# Patient Record
Sex: Male | Born: 1975 | Race: White | Hispanic: No | Smoking: Current every day smoker
Health system: Southern US, Community
[De-identification: ages and names within clinical notes are randomized; demographics above are authoritative.]

## PROBLEM LIST (undated history)

## (undated) DIAGNOSIS — T07XXXA Unspecified multiple injuries, initial encounter: Secondary | ICD-10-CM

## (undated) DIAGNOSIS — E119 Type 2 diabetes mellitus without complications: Secondary | ICD-10-CM

## (undated) DIAGNOSIS — G8929 Other chronic pain: Secondary | ICD-10-CM

## (undated) DIAGNOSIS — M549 Dorsalgia, unspecified: Secondary | ICD-10-CM

## (undated) DIAGNOSIS — S2239XA Fracture of one rib, unspecified side, initial encounter for closed fracture: Secondary | ICD-10-CM

## (undated) DIAGNOSIS — S2249XA Multiple fractures of ribs, unspecified side, initial encounter for closed fracture: Secondary | ICD-10-CM

## (undated) DIAGNOSIS — Z22322 Carrier or suspected carrier of Methicillin resistant Staphylococcus aureus: Secondary | ICD-10-CM

## (undated) HISTORY — PX: NO PAST SURGERIES: SHX2092

---

## 1997-11-30 ENCOUNTER — Emergency Department (HOSPITAL_COMMUNITY): Admission: EM | Admit: 1997-11-30 | Discharge: 1997-11-30 | Payer: Self-pay | Admitting: Emergency Medicine

## 2004-01-09 ENCOUNTER — Emergency Department (HOSPITAL_COMMUNITY): Admission: EM | Admit: 2004-01-09 | Discharge: 2004-01-10 | Payer: Self-pay | Admitting: Emergency Medicine

## 2008-01-13 DIAGNOSIS — Z22322 Carrier or suspected carrier of Methicillin resistant Staphylococcus aureus: Secondary | ICD-10-CM

## 2008-01-13 HISTORY — DX: Carrier or suspected carrier of methicillin resistant Staphylococcus aureus: Z22.322

## 2009-08-07 ENCOUNTER — Inpatient Hospital Stay (HOSPITAL_COMMUNITY): Admission: EM | Admit: 2009-08-07 | Discharge: 2009-08-11 | Payer: Self-pay | Admitting: Emergency Medicine

## 2010-03-29 LAB — CBC
HCT: 47.8 % (ref 39.0–52.0)
Hemoglobin: 13.8 g/dL (ref 13.0–17.0)
Hemoglobin: 15.2 g/dL (ref 13.0–17.0)
MCH: 32.9 pg (ref 26.0–34.0)
MCHC: 34.5 g/dL (ref 30.0–36.0)
MCHC: 34.5 g/dL (ref 30.0–36.0)
MCHC: 34.5 g/dL (ref 30.0–36.0)
MCHC: 34.9 g/dL (ref 30.0–36.0)
MCV: 95.2 fL (ref 78.0–100.0)
MCV: 95.4 fL (ref 78.0–100.0)
MCV: 96.6 fL (ref 78.0–100.0)
Platelets: 151 10*3/uL (ref 150–400)
Platelets: 177 10*3/uL (ref 150–400)
Platelets: 178 10*3/uL (ref 150–400)
RBC: 4.95 MIL/uL (ref 4.22–5.81)
WBC: 10.9 10*3/uL — ABNORMAL HIGH (ref 4.0–10.5)
WBC: 4 10*3/uL (ref 4.0–10.5)

## 2010-03-29 LAB — WOUND CULTURE

## 2010-03-29 LAB — RAPID URINE DRUG SCREEN, HOSP PERFORMED
Barbiturates: NOT DETECTED
Benzodiazepines: NOT DETECTED
Tetrahydrocannabinol: POSITIVE — AB

## 2010-03-29 LAB — BASIC METABOLIC PANEL
Calcium: 9.2 mg/dL (ref 8.4–10.5)
Chloride: 100 mEq/L (ref 96–112)
Chloride: 103 mEq/L (ref 96–112)
Creatinine, Ser: 0.81 mg/dL (ref 0.4–1.5)
GFR calc Af Amer: >60 mL/min (ref >60)
GFR calc Af Amer: >60 mL/min (ref >60)
GFR calc non Af Amer: >60 mL/min (ref >60)
Glucose, Bld: 100 mg/dL — ABNORMAL HIGH (ref 70–99)
Potassium: 3.7 mEq/L (ref 3.5–5.1)
Sodium: 139 mEq/L (ref 135–145)

## 2010-03-29 LAB — DIFFERENTIAL
Lymphs Abs: 1 10*3/uL (ref 0.7–4.0)
Monocytes Relative: 8 % (ref 3–12)
Neutro Abs: 9 10*3/uL — ABNORMAL HIGH (ref 1.7–7.7)

## 2010-03-29 LAB — CULTURE, BLOOD (ROUTINE X 2)

## 2010-08-18 ENCOUNTER — Emergency Department (HOSPITAL_COMMUNITY)
Admission: EM | Admit: 2010-08-18 | Discharge: 2010-08-18 | Disposition: A | Discharge status: Home / Self Care | Payer: Self-pay | Attending: Emergency Medicine | Admitting: Emergency Medicine

## 2010-08-18 DIAGNOSIS — M7989 Other specified soft tissue disorders: Secondary | ICD-10-CM | POA: Insufficient documentation

## 2010-08-18 DIAGNOSIS — I4949 Other premature depolarization: Secondary | ICD-10-CM | POA: Insufficient documentation

## 2010-08-18 DIAGNOSIS — M79609 Pain in unspecified limb: Secondary | ICD-10-CM | POA: Insufficient documentation

## 2011-02-16 ENCOUNTER — Encounter (HOSPITAL_COMMUNITY): Payer: Self-pay | Admitting: Emergency Medicine

## 2011-02-16 ENCOUNTER — Emergency Department (HOSPITAL_COMMUNITY)
Admission: EM | Admit: 2011-02-16 | Discharge: 2011-02-16 | Disposition: A | Discharge status: Home / Self Care | Payer: Medicaid Other | Attending: Emergency Medicine | Admitting: Emergency Medicine

## 2011-02-16 DIAGNOSIS — M79609 Pain in unspecified limb: Secondary | ICD-10-CM | POA: Insufficient documentation

## 2011-02-16 DIAGNOSIS — L738 Other specified follicular disorders: Secondary | ICD-10-CM | POA: Insufficient documentation

## 2011-02-16 DIAGNOSIS — IMO0001 Reserved for inherently not codable concepts without codable children: Secondary | ICD-10-CM | POA: Insufficient documentation

## 2011-02-16 DIAGNOSIS — R221 Localized swelling, mass and lump, neck: Secondary | ICD-10-CM | POA: Insufficient documentation

## 2011-02-16 DIAGNOSIS — R220 Localized swelling, mass and lump, head: Secondary | ICD-10-CM | POA: Insufficient documentation

## 2011-02-16 DIAGNOSIS — M5430 Sciatica, unspecified side: Secondary | ICD-10-CM | POA: Insufficient documentation

## 2011-02-16 DIAGNOSIS — M545 Low back pain, unspecified: Secondary | ICD-10-CM | POA: Insufficient documentation

## 2011-02-16 DIAGNOSIS — L731 Pseudofolliculitis barbae: Secondary | ICD-10-CM

## 2011-02-16 DIAGNOSIS — R51 Headache: Secondary | ICD-10-CM | POA: Insufficient documentation

## 2011-02-16 DIAGNOSIS — F411 Generalized anxiety disorder: Secondary | ICD-10-CM | POA: Insufficient documentation

## 2011-02-16 DIAGNOSIS — M25569 Pain in unspecified knee: Secondary | ICD-10-CM | POA: Insufficient documentation

## 2011-02-16 DIAGNOSIS — Z8614 Personal history of Methicillin resistant Staphylococcus aureus infection: Secondary | ICD-10-CM | POA: Insufficient documentation

## 2011-02-16 HISTORY — DX: Carrier or suspected carrier of methicillin resistant Staphylococcus aureus: Z22.322

## 2011-02-16 HISTORY — DX: Dorsalgia, unspecified: M54.9

## 2011-02-16 MED ORDER — DIAZEPAM 5 MG PO TABS: 5.0000 mg | ORAL_TABLET | Freq: Two times a day (BID) | ORAL | Status: AC

## 2011-02-16 MED ORDER — HYDROCODONE-ACETAMINOPHEN 5-325 MG PO TABS: 1.0000 | ORAL_TABLET | ORAL | Status: AC | PRN

## 2011-02-16 MED ORDER — DOXYCYCLINE HYCLATE 100 MG PO CAPS: 100.0000 mg | ORAL_CAPSULE | Freq: Two times a day (BID) | ORAL | Status: AC

## 2011-02-16 MED ORDER — IBUPROFEN 800 MG PO TABS: 800.0000 mg | ORAL_TABLET | Freq: Three times a day (TID) | ORAL | Status: AC

## 2011-02-16 NOTE — ED Provider Notes (Signed)
History     CSN: [id number]  Arrival date & time 02/16/11  1039   First MD Initiated Contact with Patient 02/16/11 1113      Chief Complaint  Patient presents with  . Back Pain    (Consider location/radiation/quality/duration/timing/severity/associated sxs/prior treatment) Patient is a 36 y.o. male presenting with back pain. The history is provided by the patient.  Back Pain  This is a new problem. The current episode started more than 1 week ago. The problem occurs constantly. The problem has not changed since onset.The pain is associated with no known injury. The pain is present in the lumbar spine and gluteal region. The quality of the pain is described as shooting. The pain radiates to the right thigh, right knee and right foot. The pain is moderate. The symptoms are aggravated by bending, twisting and certain positions. The pain is the same all the time. Stiffness is present in the morning. Pertinent negatives include no fever, no numbness, no bowel incontinence, no perianal numbness, no bladder incontinence, no dysuria, no pelvic pain, no tingling and no weakness. He has tried analgesics for the symptoms.   Pt with hx of back pain presents with 3 weeks of pain. Pain radiates into R buttock and down R leg. Denies difficulty walking/changes in gait. 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 No known injury; states he was sleeping on an uncomfortable bed and thought that may be causing his sx.  He also presents with a bump to his inner R nostril. States it has been there for 3 days and seems to be worsening; has noted redness and swelling around it. 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  Past Medical History  Diagnosis Date  . Back pain   . MRSA (methicillin resistant staph aureus) culture positive     History reviewed. No pertinent past surgical history.  No family history on file.  History  Substance Use Topics  . Smoking status: Current Everyday Smoker  . Smokeless  tobacco: Not on file  . Alcohol Use: Yes      Review of Systems  Constitutional: Negative for fever.  HENT: Negative for facial swelling.   Gastrointestinal: Negative for bowel incontinence.  Genitourinary: Negative for bladder incontinence, dysuria and pelvic pain.  Musculoskeletal: Positive for back pain.  Neurological: Negative for tingling, weakness and numbness.  All other systems reviewed and are negative.    Allergies  Review of patient's allergies indicates no known allergies.  Home Medications  No current outpatient prescriptions on file.  BP 125/72  Pulse 80  Temp 98.3 F (36.8 C)  Resp 16  SpO2 99%  Physical Exam  Nursing note and vitals reviewed. Constitutional: He appears well-developed and well-nourished. No distress.  HENT:  Head: Normocephalic and atraumatic.  Right Ear: External ear normal.  Left Ear: External ear normal.       Mild erythema just under R nostril. Mild swelling noted to inside of R nostril without obvious area of fluctuance.  Eyes: Conjunctivae and EOM are normal.  Neck: Normal range of motion. Neck supple.  Cardiovascular: Normal rate.   Pulmonary/Chest: Effort normal.  Musculoskeletal: He exhibits no edema.       Spine: No palpable stepoff, crepitus, or gross deformity appreciated. No midline tenderness. Spasm of paravertebral muscles R, tenderness to palp over gluteus. R straight leg raise +. LEs neurovasc intact b/l, good DP/PT pulses.   Lymphadenopathy:    He has no cervical adenopathy.  Neurological: He is  alert. He has normal strength.  Reflex Scores:      Achilles reflexes are 2+ on the right side and 2+ on the left side. Skin: Skin is warm and dry. He is not diaphoretic.  Psychiatric: His mood appears anxious.    ED Course  Procedures (including critical care time)  Labs Reviewed - No data to display No results found.   1. Sciatica   2. Ingrown hair       MDM  This generally healthy male presents with back  pain x 3 weeks. He has no "red flag" symptoms indicating more worrisome back pain etiologies. Positive straight leg raise. Spasm to R paravertebrals as well. Will start on high dose NSAID for several days. Encouraged icing, stretching exercise. Discussed importance of ortho f/u.  The redness to his nose appears to be folliculitis/ingrowing hair. No indication for drainage. Will start on doxy as he is MRSA+.   Return precautions discussed.        Grant Fontana, [place] 02/16/11 2111

## 2011-02-16 NOTE — ED Notes (Signed)
Back pain x 3 weeks sleeping on daybed and thought it was that  But is no better and nose has a bump that he thinks is infected x 3 days pt aaox3

## 2011-02-18 NOTE — ED Provider Notes (Signed)
Medical screening examination/treatment/procedure(s) were performed by non-physician practitioner and as supervising physician I was immediately available for consultation/collaboration.  Gerhard Munch, MD 02/18/11 2047

## 2011-04-14 ENCOUNTER — Other Ambulatory Visit: Payer: Self-pay | Admitting: Family Medicine

## 2011-04-14 DIAGNOSIS — M545 Low back pain: Secondary | ICD-10-CM

## 2011-04-14 DIAGNOSIS — R202 Paresthesia of skin: Secondary | ICD-10-CM

## 2011-04-15 ENCOUNTER — Inpatient Hospital Stay: Admission: RE | Admit: 2011-04-15 | Payer: Medicaid Other | Source: Ambulatory Visit

## 2011-04-21 ENCOUNTER — Inpatient Hospital Stay: Admission: RE | Admit: 2011-04-21 | Payer: Medicaid Other | Source: Ambulatory Visit

## 2011-08-10 ENCOUNTER — Ambulatory Visit
Admission: RE | Admit: 2011-08-10 | Discharge: 2011-08-10 | Disposition: A | Discharge status: Home / Self Care | Payer: Medicaid Other | Source: Ambulatory Visit | Attending: Rehabilitation | Admitting: Rehabilitation

## 2011-08-10 ENCOUNTER — Other Ambulatory Visit: Payer: Self-pay | Admitting: Rehabilitation

## 2011-08-10 DIAGNOSIS — M549 Dorsalgia, unspecified: Secondary | ICD-10-CM

## 2013-06-13 ENCOUNTER — Encounter (HOSPITAL_COMMUNITY): Payer: Self-pay | Admitting: Emergency Medicine

## 2013-06-13 ENCOUNTER — Emergency Department (HOSPITAL_COMMUNITY)
Admission: EM | Admit: 2013-06-13 | Discharge: 2013-06-13 | Disposition: A | Discharge status: Home / Self Care | Payer: Medicaid Other | Attending: Emergency Medicine | Admitting: Emergency Medicine

## 2013-06-13 DIAGNOSIS — G8929 Other chronic pain: Secondary | ICD-10-CM | POA: Insufficient documentation

## 2013-06-13 DIAGNOSIS — F17200 Nicotine dependence, unspecified, uncomplicated: Secondary | ICD-10-CM | POA: Insufficient documentation

## 2013-06-13 DIAGNOSIS — M545 Low back pain, unspecified: Secondary | ICD-10-CM | POA: Insufficient documentation

## 2013-06-13 DIAGNOSIS — Z8614 Personal history of Methicillin resistant Staphylococcus aureus infection: Secondary | ICD-10-CM | POA: Insufficient documentation

## 2013-06-13 DIAGNOSIS — M549 Dorsalgia, unspecified: Secondary | ICD-10-CM

## 2013-06-13 MED ORDER — PREDNISONE 20 MG PO TABS: 40.0000 mg | ORAL_TABLET | Freq: Every day | ORAL | Status: DC

## 2013-06-13 MED ORDER — OXYCODONE-ACETAMINOPHEN 5-325 MG PO TABS: 1.0000 | ORAL_TABLET | ORAL | Status: DC | PRN

## 2013-06-13 NOTE — ED Provider Notes (Signed)
Medical screening examination/treatment/procedure(s) were performed by non-physician practitioner and as supervising physician I was immediately available for consultation/collaboration.   EKG Interpretation None       Doug Sou, MD 06/13/13 [phone]

## 2013-06-13 NOTE — Discharge Instructions (Signed)
Take the prescribed medication as directed. Follow-up with the [facility] or other primary care physician in the area. I have attached a resource guide to help you with this. Return to the ED for new or worsening symptoms.   Emergency Department Resource Guide 1) Find a Doctor and Pay Out of Pocket Although you won't have to find out who is covered by your insurance plan, it is a good idea to ask around and get recommendations. You will then need to call the office and see if the doctor you have chosen will accept you as a new patient and what types of options they offer for patients who are self-pay. Some doctors offer discounts or will set up payment plans for their patients who do not have insurance, but you will need to ask so you aren't surprised when you get to your appointment.  2) Contact Your Local Health Department Not all health departments have doctors that can see patients for sick visits, but many do, so it is worth a call to see if yours does. If you don't know where your local health department is, you can check in your phone book. The CDC also has a tool to help you locate your state's health department, and many state websites also have listings of all of their local health departments.  3) Find a Walk-in Clinic If your illness is not likely to be very severe or complicated, you may want to try a walk in clinic. These are popping up all over the country in pharmacies, drugstores, and shopping centers. They're usually staffed by nurse practitioners or physician assistants that have been trained to treat common illnesses and complaints. They're usually fairly quick and inexpensive. However, if you have serious medical issues or chronic medical problems, these are probably not your best option.  No Primary Care Doctor: - Call Health Connect at  [phone] - they can help you locate a primary care doctor that  accepts your insurance, provides certain services, etc. - Physician  Referral Service- [phone]  Chronic Pain Problems: Organization         Address  Phone   Notes  [facility]  [phone] Patients need to be referred by their primary care doctor.   Medication Assistance: Organization         Address  Phone   Notes  [facility] Medication [facility] [address] [place] [place], [place] [address] [phone] --Must be a resident of [facility] -- Must have NO insurance coverage whatsoever (no Medicaid/ Medicare, etc.) -- The pt. MUST have a primary care doctor that directs their care regularly and follows them in the community   [facility]  [phone]   [employer]  [phone]    Agencies that provide inexpensive medical care: Organization         Address  Phone   Notes  [facility]  [phone]   [facility]    [phone]   [facility] [address] [place], [place] [address] [phone]   [facility] [address] [place], [place] [phone]   Planned Parenthood    [phone]   [facility]    [phone]   Community Health and [facility]  [address], [place] Phone:  [phone], Fax:  [phone] Hours of Operation:  9 am - 6 pm, M-F.  Also accepts Medicaid/Medicare and  self-pay.  [facility] for [place], [address], [place] Phone: [phone], Fax: [phone]. Hours of Operation:  8:30 am - 5:30 pm, M-F.  Also accepts Medicaid and self-pay.  [facility] [address] [place] Phone: [phone]   [place], [place], [place] [phone], Ext. [phone] Mondays & Thursdays: 7-9 AM.  First 15 patients are seen on a first come, first serve basis.    [place] Providers:  Organization         Address  Phone   Notes  [facility] [address], [place] [phone] Also accepts self-pay patients.  [facility] [address], [place]  [phone]   [place], [address], [place] [phone]   [facility] [address] [place] [phone]   Lucianne Lei [address], [place]   [phone] Only accepts [place] Access [place] patients after they have their name applied to their card.   Self-Pay (no insurance) in [employer]:  Organization         Address  Phone   Notes  Sickle Cell Patients, [facility] Internal Medicine [place] [phone]   [facility] Urgent Care [place] [phone]   [facility] [place]  [place], [place],  [phone]   [facility]/Dr. Osei-Bonsu  [address] [place] or [address], [place] [phone] Phone number for both [place] and [place] locations is the same.  Urgent Medical and [facility] [address] [place] [phone]   [facility] [address], [place] or [address] [phone]   [facility] [address] [place] [phone], phone; [phone], fax Sees patients 1st and 3rd Saturday of every month.  Must not qualify for public or private insurance (i.e. Medicaid, Medicare, [place] Health Choice, Veterans' Benefits)  Household income should be no more than 200% of the poverty level The clinic cannot treat you if you are pregnant or think you are pregnant  Sexually transmitted diseases are not treated at the clinic.    Dental Care: Organization         Address  Phone  Notes  [facility] Department of [facility] [phone] Accepts children up to age 40 who are enrolled  in [place] or [place]; pregnant women with a Medicaid card; and children who have applied for Medicaid or [place] Health Choice, but were declined, whose parents can pay a reduced fee at time of service.  [facility] Department of [facility]  [address], [place] [phone] Accepts children up to age 37 who are enrolled in [place] or [place]; pregnant women with a Medicaid card; and children who have applied for Medicaid or [place] Health Choice, but were declined, whose parents can pay a reduced fee at time of service.  [place] Adult [facility] Access PROGRAM  [place] [phone] Patients are seen by appointment only. Walk-ins are not accepted. [place] will see patients 57 years of age and older. Monday - Tuesday (8am-5pm) Most Wednesdays (8:30-5pm) $  30 per visit, cash only  [facility] Adult [employer] PROGRAM  [address], [facility] [phone] Patients are seen by appointment only. Walk-ins are not accepted. [place] will see patients 62 years of age and older. One Wednesday Evening (Monthly: Volunteer Based).  $30 per visit, cash only  [place]  [phone] for adults; Children under age 14, call Graduate Pediatric Dentistry at [phone]. Children aged 37-14, please call [phone] to request a pediatric application.  Dental services are provided in all areas of dental care including fillings, crowns and bridges, complete and partial dentures, implants, gum treatment, root canals, and extractions. Preventive care is also provided. Treatment is provided to both adults and children. Patients are selected via a lottery and there is often a waiting list.   [facility] [address] [place]  [phone] [email or web address]   [facility] [address] [place], [place] [phone], Ext. [phone] Second and Fourth Thursday of each month, opens at  6:30 AM; Clinic ends at 9 AM.  Patients are seen on a first-come first-served basis, and a limited number are seen during each clinic.   [facility]  [address] Hillard Danker [place], [place] [phone]   Eligibility Requirements You must have lived in [place], [place], or [place] counties for at least the last three months.   You cannot be eligible for state or federal sponsored [employer], including [employer], [place], or [employer].   You generally cannot be eligible for healthcare insurance through your employer.    How to apply: Eligibility screenings are held every Tuesday and Wednesday afternoon from 1:00 pm until 4:00 pm. You do not need an appointment for the interview!  [facility] [address] [place], [place]   [place]  [place] Department  [place]  [phone]    Behavioral Health Resources in the Community: Intensive Outpatient Programs Organization         Address  Phone  Notes  [facility] [place]. [address] [place], [place] [phone]   [facility] Outpatient [address], [place], [place]   ADS: Alcohol & Drug Svcs [address] [place], [employer]   [place] [address]  [place], [place] or [phone]   Substance Abuse Resources Organization         Address  Phone  Notes  Alcohol and Drug Services  [phone]   [place]  [phone]   [facility] [place]   Chinita Pester  [phone]   Residential & Outpatient Substance Abuse Program  [phone]   Psychological Services Organization         Address  Phone  Notes  [facility]  [place]  [phone]   [place] [address] [place] or [phone]    Mobile Crisis Teams Organization         Address  Phone  Notes  [facility], Mobile Crisis Care Unit  [phone]   [facility]  [address] [place], [place]   [place] [address] [place] [phone]    Self-Help/Support Groups Organization         Address  Phone  Notes  [facility]ental [facility]oc[facility] of [place] - variety of support groups  [place] Call for more information  Narcotics Anonymous (NA), [facility] [address], [employer] [place]  2 meetings at this location   Special educational needs teacher         Address  Phone  Notes  [facility] Residential Treatment [place],    [place]  [phone]   [facility]  [address], [place] [phone], [place], [place]   [place], [place] [phone] Admissions: 8am-3pm M-F  Incentives Substance [place] [address],    [place], [place] [phone]   [facility] [address] [place], [place], [place]   [facility] [address],  [place], [place]   [facility]ight Programs - Intensive Outpatient [address] Kristeen [address], [place], [place]   [facility] ([place].) [place].,  [place], [place] [phone] or [phone]   [facility] Services (RTS) [address] [place], [place] Accepts Medicaid  [place] [address]  [place] [phone] Substance Abuse/Addiction Treatment   [facility] Organization         Address  Phone  Notes  [facility]  [phone]   Domenic Schwab, PhD [address] Arlis Porta [place], [place]   [phone] or [phone]   [place], [place] [phone]   [facility]  [address] [place], [place] [phone] Insurance/Medicaid/sponsorship through [facility] and Families [address], [address] [place]                                    [place], [place] [phone] [place] [address], [place] [phone]    Dr. Adele Schilder  [phone]   [facility] Dept. 1) [address], [place] 2) [place] 3)  [place] [address], [place] [phone]  [phone]   [place] [phone] or [phone] (After Hours)

## 2013-06-13 NOTE — ED Provider Notes (Signed)
CSN: [id number]     Arrival date & time 06/13/13  1334 History  This chart was scribed for non-physician practitioner Sharilyn SitesLisa Noraa Pickeral, PA-C working with Doug SouSam Jacubowitz, MD by Leone PayorSonum Patel, ED Scribe. This patient was seen in room TR10C/TR10C and the patient's care was started at 3:09 PM.    Chief Complaint  Patient presents with  . Back Pain    The history is provided by the patient. No language interpreter was used.    HPI Comments: Mitchell Long is a 38 y.o. male with past medical history of back pain who presents to the Emergency Department complaining of a few days of constant, unchanged, left low back pain that radiates down the LLE. He describes this pain as a burning sensation. He denies recent falls, injuries, or trauma. He reports similar symptoms on the right side when he was diagnosed with sciatica. He denies numbness, paresthesias, or weakness of lower extremities.  No loss of bowel or bladder control.  Has been taking tylenol or motrin without noted improvement.  Past Medical History  Diagnosis Date  . Back pain   . MRSA (methicillin resistant staph aureus) culture positive    History reviewed. No pertinent past surgical history. History reviewed. No pertinent family history. History  Substance Use Topics  . Smoking status: Current Every Day Smoker  . Smokeless tobacco: Not on file  . Alcohol Use: Yes    Review of Systems  Musculoskeletal: Positive for back pain.  Neurological: Negative for weakness and numbness.  All other systems reviewed and are negative.     Allergies  Vicodin  Home Medications   Prior to Admission medications   Not on File   BP 119/71  Pulse 66  Temp(Src) 98.1 F (36.7 C) (Oral)  Resp 14  SpO2 97% Physical Exam  Nursing note and vitals reviewed. Constitutional: He is oriented to person, place, and time. He appears well-developed and well-nourished. No distress.  HENT:  Head: Normocephalic and atraumatic.  Mouth/Throat: Oropharynx  is clear and moist.  Eyes: Conjunctivae and EOM are normal. Pupils are equal, round, and reactive to light.  Neck: Normal range of motion. Neck supple.  Cardiovascular: Normal rate, regular rhythm and normal heart sounds.   Pulmonary/Chest: Effort normal and breath sounds normal. No respiratory distress. He has no wheezes.  Musculoskeletal: Normal range of motion. He exhibits tenderness.  Pain of bilateral SI joints.  No midline tenderness.  No gross deformities. Full ROM. +SLR bilaterally, right worse than left.  Distal sensation intact BLE.  Pain worse with ambulating but no difficulty doing so.  Neurological: He is alert and oriented to person, place, and time.  Skin: Skin is warm and dry. He is not diaphoretic.  Psychiatric: He has a normal mood and affect.    ED Course  Procedures (including critical care time)  DIAGNOSTIC STUDIES: Oxygen Saturation is 97% on RA, adequate by my interpretation.    COORDINATION OF CARE: 3:14 PM Discussed treatment plan with pt at bedside and pt agreed to plan.   Labs Review Labs Reviewed - No data to display  Imaging Review No results found.   EKG Interpretation None      MDM   Final diagnoses:  Back pain   Acute on chronic back pain related to his sciatica. Patient has no red flag symptoms on exam to suggest cauda equina or other neurovascular compromise. Patient started on Percocet and prednisone. He was strongly encouraged to followup with a primary care physician for routine management  of his pain.  Discussed plan with patient, he/she acknowledged understanding and agreed with plan of care.  Return precautions given for new or worsening symptoms.  I personally performed the services described in this documentation, which was scribed in my presence. The recorded information has been reviewed and is accurate.  Garlon Hatchet, PA-C 06/13/13 [phone]

## 2013-06-13 NOTE — ED Notes (Addendum)
Pt reports hx of back pain to right side but this am started having burning pain to left lower back that radiates down his leg.

## 2013-06-13 NOTE — ED Notes (Signed)
Hx of chronic right lower back pain. Woke yesterday with left lower back pain to left leg.

## 2013-06-19 ENCOUNTER — Encounter (HOSPITAL_COMMUNITY): Payer: Self-pay | Admitting: Emergency Medicine

## 2013-06-19 ENCOUNTER — Emergency Department (HOSPITAL_COMMUNITY)
Admission: EM | Admit: 2013-06-19 | Discharge: 2013-06-19 | Disposition: A | Discharge status: Home / Self Care | Payer: Medicaid Other | Attending: Emergency Medicine | Admitting: Emergency Medicine

## 2013-06-19 DIAGNOSIS — Z8614 Personal history of Methicillin resistant Staphylococcus aureus infection: Secondary | ICD-10-CM | POA: Insufficient documentation

## 2013-06-19 DIAGNOSIS — M545 Low back pain, unspecified: Secondary | ICD-10-CM | POA: Insufficient documentation

## 2013-06-19 DIAGNOSIS — F17200 Nicotine dependence, unspecified, uncomplicated: Secondary | ICD-10-CM | POA: Insufficient documentation

## 2013-06-19 DIAGNOSIS — IMO0002 Reserved for concepts with insufficient information to code with codable children: Secondary | ICD-10-CM | POA: Insufficient documentation

## 2013-06-19 DIAGNOSIS — R52 Pain, unspecified: Secondary | ICD-10-CM | POA: Insufficient documentation

## 2013-06-19 DIAGNOSIS — G8929 Other chronic pain: Secondary | ICD-10-CM | POA: Insufficient documentation

## 2013-06-19 HISTORY — DX: Dorsalgia, unspecified: M54.9

## 2013-06-19 HISTORY — DX: Other chronic pain: G89.29

## 2013-06-19 MED ORDER — OXYCODONE-ACETAMINOPHEN 5-325 MG PO TABS
1.0000 | ORAL_TABLET | Freq: Once | ORAL | Status: AC
  Administered 2013-06-19: 1 via ORAL
  Filled 2013-06-19: qty 1

## 2013-06-19 MED ORDER — KETOROLAC TROMETHAMINE 30 MG/ML IJ SOLN
15.0000 mg | Freq: Once | INTRAMUSCULAR | Status: DC
  Filled 2013-06-19: qty 1

## 2013-06-19 MED ORDER — OXYCODONE-ACETAMINOPHEN 5-325 MG PO TABS: 1.0000 | ORAL_TABLET | Freq: Three times a day (TID) | ORAL | Status: DC | PRN

## 2013-06-19 MED ORDER — METHOCARBAMOL 500 MG PO TABS: 500.0000 mg | ORAL_TABLET | Freq: Two times a day (BID) | ORAL | Status: DC

## 2013-06-19 MED ORDER — IBUPROFEN 600 MG PO TABS: 600.0000 mg | ORAL_TABLET | Freq: Four times a day (QID) | ORAL | Status: DC | PRN

## 2013-06-19 NOTE — ED Provider Notes (Signed)
CSN: [id number]     Arrival date & time 06/19/13  2035 History  This chart was scribed for Mitchell Mutton, PA-C working with [name], MD by [name], ED Scribe. This patient was seen in room TR08C/TR08C and the patient's care was started at 10:35 PM.    Chief Complaint  Patient presents with  . Back Pain   HPI HPI Comments: Mitchell Long is a 38 y.o. male with PMHx of chronic back pain presenting to the ED with acute exacerbation of chronic back pain that has been ongoing for the past 2 weeks. As per patient reported that the back pain has gotten worse ever since he slept on an air mattress. Stated that most of his discomfort is localized to the lower back described as a sharp, shooting pain that is constant with radiation down the left leg. Stated that he was seen and assessed in the ED setting a couple of days ago where pain medications were prescribed - stated that he was prescribed Percocets and prednisone - stated that the medications made the pain somewhat bearable, but did not give full relief. Stated that he is oriented to the medications, last dose was yesterday. As per patient, reported that he's been battling back pain since he was 38 years of age. Denied fall, injuries, trauma, loss of sensation to the lower extremities, chest pain, shortness of breath, difficulty breathing, urinary or bowel incontinence. PCP none Neurosurgery none  Past Medical History  Diagnosis Date  . Back pain   . MRSA (methicillin resistant staph aureus) culture positive   . Back pain, chronic    History reviewed. No pertinent past surgical history. No family history on file. History  Substance Use Topics  . Smoking status: Current Every Day Smoker  . Smokeless tobacco: Not on file  . Alcohol Use: Yes    Review of Systems  Respiratory: Negative for shortness of breath.   Cardiovascular: Negative for chest pain.  Musculoskeletal: Positive for back pain.  Neurological: Negative for  weakness and numbness.    Allergies  Vicodin  Home Medications   Prior to Admission medications   Medication Sig Start Date End Date Taking? Authorizing Provider  oxyCODONE-acetaminophen (PERCOCET/ROXICET) 5-325 MG per tablet Take 1 tablet by mouth every 4 (four) hours as needed. 06/13/13  Yes Garlon Hatchet, PA-C  predniSONE (DELTASONE) 20 MG tablet Take 2 tablets (40 mg total) by mouth daily. Take 40 mg by mouth daily for 3 days, then 20mg  by mouth daily for 3 days, then 10mg  daily for 3 days 06/13/13  Yes Garlon Hatchet, PA-C  ibuprofen (ADVIL,MOTRIN) 600 MG tablet Take 1 tablet (600 mg total) by mouth every 6 (six) hours as needed. 06/19/13   Conrado Nance, PA-C  methocarbamol (ROBAXIN) 500 MG tablet Take 1 tablet (500 mg total) by mouth 2 (two) times daily. 06/19/13   Marilouise Densmore, PA-C  oxyCODONE-acetaminophen (PERCOCET/ROXICET) 5-325 MG per tablet Take 1 tablet by mouth every 8 (eight) hours as needed for moderate pain or severe pain. 06/19/13   Chantille Navarrete, PA-C   Triage Vitals: BP 115/69  Pulse 76  Temp(Src) 97.6 F (36.4 C)  Resp 18  SpO2 98%  Physical Exam  Nursing note and vitals reviewed. Constitutional: He is oriented to person, place, and time. He appears well-developed and well-nourished. No distress.  HENT:  Head: Normocephalic and atraumatic.  Eyes: Conjunctivae and EOM are normal. Pupils are equal, round, and reactive to light. Right eye exhibits no discharge. Left eye  exhibits no discharge.  Neck: Normal range of motion. Neck supple. No tracheal deviation present.  Cardiovascular: Normal rate, regular rhythm and normal heart sounds.  Exam reveals no friction rub.   No murmur heard. Pulses:      Radial pulses are 2+ on the right side, and 2+ on the left side.       Dorsalis pedis pulses are 2+ on the right side, and 2+ on the left side.  Cap refill less than 3 seconds  Pulmonary/Chest: Effort normal and breath sounds normal. No respiratory distress. He has no  wheezes. He has no rales.  Musculoskeletal: Normal range of motion.       Lumbar back: He exhibits tenderness and bony tenderness. He exhibits normal range of motion, no swelling, no edema, no deformity, no laceration and no pain.       Back:  Negative deformities identified to the spine. Discomfort upon palpation to the mid lumbosacral spine and bilateral paraspinal regions. Full ROM to upper and lower extremities without difficulty noted, negative ataxia noted.  Lymphadenopathy:    He has no cervical adenopathy.  Neurological: He is alert and oriented to person, place, and time. No cranial nerve deficit. He exhibits normal muscle tone. Coordination normal.  Cranial nerves III-XII grossly intact Strength 5+/5+ to upper and lower extremities bilaterally with resistance applied, equal distribution noted Equal grip strength bilaterally Sensation intact with differentiation to sharp and dull touch Negative arm drift Negative facial drooping Negative slurred speech Negative aphasia Negative Romberg Gait negative ataxia - limp identified with apply pressure to the left leg  Skin: Skin is warm and dry. No rash noted. He is not diaphoretic. No erythema.  Psychiatric: He has a normal mood and affect. His behavior is normal. Thought content normal.    ED Course  Procedures (including critical care time) DIAGNOSTIC STUDIES: Oxygen Saturation is 98% on RA, Normal by my interpretation.    COORDINATION OF CARE: 10:43 PM-Discussed treatment plan which includes pain medication and referral to neuro surgery with pt at bedside and pt agreed to plan.    Labs Review Labs Reviewed - No data to display  Imaging Review No results found.   EKG Interpretation None      MDM   Final diagnoses:  Acute exacerbation of chronic low back pain   Medications  oxyCODONE-acetaminophen (PERCOCET/ROXICET) 5-325 MG per tablet 1 tablet (1 tablet Oral Given 06/19/13 2215)    I personally performed the  services described in this documentation, which was scribed in my presence. The recorded information has been reviewed and is accurate.  This provider reviewed patient's chart. Patient has been dealing with back pain for many years-chronic issue. Patient had a MRI of the lumbar spine performed in 2013 with disc protrusion identified to the L4-L5 with degenerative disc disease identified. Has had no imaging since then. Patient presenting to the ED with chronic back pain. Patient has been battling her pain since he was 38 years of age and has gotten progressively worse over the last couple of years. Stated that he has not seen by a physician nor neurosurgeon. Denied history of any form of surgery. Patient reported that a couple of days ago he slept on an air mattress and his back pain exacerbated. Negative deformities identified to the spine. Discomfort upon palpation to the mid lumbosacral and paravertebral regions. Full range of motion to upper and lower extremities bilaterally without ataxia or difficulty. Strength intact with equal distribution. Sensation intact with differentiation to sharp and  dull touch. Patient is able to standup-negative Romberg. Patient is able to walk but has a limp secondary to pain upon pressure to the left lower extremity. Doubt cauda equina syndrome. Doubt epidural abscess. Suspicion to be acute exacerbation of chronic back pain. No signs of trauma or documentation of trauma. No emergent imaging needed at this time, no emergent MRI needed-recommended that patient will need MRI in near future. Pain controlled in ED setting. Negative focal neurological deficits noted. Patient stable, afebrile. Patient not septic appearing. Discharged patient. [facility] and [facility]. Discussed with patient to rest and stay hydrated. Recommended water therapy. Discharged patient with a small dose of pain medications-discussed course, cautions, disposal technique.  Discussed with patient to closely monitor symptoms and if symptoms are to worsen or change to report back to the ED - strict return instructions given.  Patient agreed to plan of care, understood, all questions answered.   Mitchell Mutton, PA-C 06/20/13 1816

## 2013-06-19 NOTE — Discharge Instructions (Signed)
Please call and set up an appointment with orthopedics and neurosurgery Highly recommend neurosurgery consult and MRI to be performed in the near future Please rest and stay hydrated Please apply icy hot ointment and massage to lower back for muscular relief Highly recommend a back brace for comfort Please take medications as prescribed-while on pain medications his be no alcohol, driving, operating any heavy machinery. If there is extra please dispose in a proper manner. Please do not take any extra Tylenol for this can lead to Tylenol overdose and liver issues. Please avoid any heavy lifting Please continue to monitor symptoms closely if symptoms are to worsen or change (fever greater than 101, chills, chest pain, shortness of breath, difficulty breathing, numbness, tingling, fall, injury, loss of sensation, inability to control urine or bowel movements, weakness) please report back to the ED immediately  Back Pain, Adult Low back pain is very common. About 1 in 5 people have back pain.The cause of low back pain is rarely dangerous. The pain often gets better over time.About half of people with a sudden onset of back pain feel better in just 2 weeks. About 8 in 10 people feel better by 6 weeks.  CAUSES Some common causes of back pain include:  Strain of the muscles or ligaments supporting the spine.  Wear and tear (degeneration) of the spinal discs.  Arthritis.  Direct injury to the back. DIAGNOSIS Most of the time, the direct cause of low back pain is not known.However, back pain can be treated effectively even when the exact cause of the pain is unknown.Answering your caregiver's questions about your overall health and symptoms is one of the most accurate ways to make sure the cause of your pain is not dangerous. If your caregiver needs more information, he or she may order lab work or imaging tests (X-rays or MRIs).However, even if imaging tests show changes in your back, this usually  does not require surgery. HOME CARE INSTRUCTIONS For many people, back pain returns.Since low back pain is rarely dangerous, it is often a condition that people can learn to [facility] their own.   Remain active. It is stressful on the back to sit or stand in one place. Do not sit, drive, or stand in one place for more than 30 minutes at a time. Take short walks on level surfaces as soon as pain allows.Try to increase the length of time you walk each day.  Do not stay in bed.Resting more than 1 or 2 days can delay your recovery.  Do not avoid exercise or work.Your body is made to move.It is not dangerous to be active, even though your back may hurt.Your back will likely heal faster if you return to being active before your pain is gone.  Pay attention to your body when you bend and lift. Many people have less discomfortwhen lifting if they bend their knees, keep the load close to their bodies,and avoid twisting. Often, the most comfortable positions are those that put less stress on your recovering back.  Find a comfortable position to sleep. Use a firm mattress and lie on your side with your knees slightly bent. If you lie on your back, put a pillow under your knees.  Only take over-the-counter or prescription medicines as directed by your caregiver. Over-the-counter medicines to reduce pain and inflammation are often the most helpful.Your caregiver may prescribe muscle relaxant drugs.These medicines help dull your pain so you can more quickly return to your normal activities and healthy exercise.  Put ice on the injured area.  Put ice in a plastic bag.  Place a towel between your skin and the bag.  Leave the ice on for 15-20 minutes, 03-04 times a day for the first 2 to 3 days. After that, ice and heat may be alternated to reduce pain and spasms.  Ask your caregiver about trying back exercises and gentle massage. This may be of some benefit.  Avoid feeling anxious or  stressed.Stress increases muscle tension and can worsen back pain.It is important to recognize when you are anxious or stressed and learn ways to manage it.Exercise is a great option. SEEK MEDICAL CARE IF:  You have pain that is not relieved with rest or medicine.  You have pain that does not improve in 1 week.  You have new symptoms.  You are generally not feeling well. SEEK IMMEDIATE MEDICAL CARE IF:   You have pain that radiates from your back into your legs.  You develop new bowel or bladder control problems.  You have unusual weakness or numbness in your arms or legs.  You develop nausea or vomiting.  You develop abdominal pain.  You feel faint. Document Released: 12/29/2004 Document Revised: 06/30/2011 Document Reviewed: 05/19/2010 [facility] Patient Information 2014 [place], [place].   Back Exercises Back exercises help treat and prevent back injuries. The goal of back exercises is to increase the strength of your abdominal and back muscles and the flexibility of your back. These exercises should be started when you no longer have back pain. Back exercises include:  Pelvic Tilt. Lie on your back with your knees bent. Tilt your pelvis until the lower part of your back is against the floor. Hold this position 5 to 10 sec and repeat 5 to 10 times.  Knee to Chest. Pull first 1 knee up against your chest and hold for 20 to 30 seconds, repeat this with the other knee, and then both knees. This may be done with the other leg straight or bent, whichever feels better.  Sit-Ups or Curl-Ups. Bend your knees 90 degrees. Start with tilting your pelvis, and do a partial, slow sit-up, lifting your trunk only 30 to 45 degrees off the floor. Take at least 2 to 3 seconds for each sit-up. Do not do sit-ups with your knees out straight. If partial sit-ups are difficult, simply do the above but with only tightening your abdominal muscles and holding it as directed.  Hip-Lift. Lie on your back  with your knees flexed 90 degrees. Push down with your feet and shoulders as you raise your hips a couple inches off the floor; hold for 10 seconds, repeat 5 to 10 times.  Back arches. Lie on your stomach, propping yourself up on bent elbows. Slowly press on your hands, causing an arch in your low back. Repeat 3 to 5 times. Any initial stiffness and discomfort should lessen with repetition over time.  Shoulder-Lifts. Lie face down with arms beside your body. Keep hips and torso pressed to floor as you slowly lift your head and shoulders off the floor. Do not overdo your exercises, especially in the beginning. Exercises may cause you some mild back discomfort which lasts for a few minutes; however, if the pain is more severe, or lasts for more than 15 minutes, do not continue exercises until you see your caregiver. Improvement with exercise therapy for back problems is slow.  See your caregivers for assistance with developing a proper back exercise program. Document Released: 02/06/2004 Document Revised: 03/23/2011 Document  Reviewed: 10/30/2010 ExitCare Patient Information 2014 [place], [place].   Emergency Department Resource Guide 1) Find a Doctor and Pay Out of Pocket Although you won't have to find out who is covered by your insurance plan, it is a good idea to ask around and get recommendations. You will then need to call the office and see if the doctor you have chosen will accept you as a new patient and what types of options they offer for patients who are self-pay. Some doctors offer discounts or will set up payment plans for their patients who do not have insurance, but you will need to ask so you aren't surprised when you get to your appointment.  2) Contact Your Local Health Department Not all health departments have doctors that can see patients for sick visits, but many do, so it is worth a call to see if yours does. If you don't know where your local health department is, you can check  in your phone book. The CDC also has a tool to help you locate your state's health department, and many state websites also have listings of all of their local health departments.  3) Find a Walk-in Clinic If your illness is not likely to be very severe or complicated, you may want to try a walk in clinic. These are popping up all over the country in pharmacies, drugstores, and shopping centers. They're usually staffed by nurse practitioners or physician assistants that have been trained to treat common illnesses and complaints. They're usually fairly quick and inexpensive. However, if you have serious medical issues or chronic medical problems, these are probably not your best option.  No Primary Care Doctor: - Call Health Connect at  [phone] - they can help you locate a primary care doctor that  accepts your insurance, provides certain services, etc. - Physician Referral Service- [phone]  Chronic Pain Problems: Organization         Address  Phone   Notes  [facility]  [phone] Patients need to be referred by their primary care doctor.   Medication Assistance: Organization         Address  Phone   Notes  [facility] Medication [facility] [address] [place], [place] [address] [phone] --Must be a resident of [facility] -- Must have NO insurance coverage whatsoever (no Medicaid/ Medicare, etc.) -- The pt. MUST have a primary care doctor that directs their care regularly and follows them in the community   [facility]  [phone]   [employer]  [phone]    Agencies that provide inexpensive medical care: Organization         Address  Phone   Notes  [facility]  [phone]   [facility]    [phone]   [facility] [address] [place], [place] [address] [phone]   [facility] [address] [place], [place] [phone]    Planned Parenthood    [phone]   [facility]    [phone]   [facility]  [address], [place] Phone:  [phone], Fax:  [phone] Hours of Operation:  9 am - 6 pm, M-F.  Also accepts Medicaid/Medicare and self-pay.  [facility]  [address], [place] Phone: [phone], Fax: [phone]. Hours of Operation:  8:30 am - 5:30 pm, M-F.  Also accepts Medicaid and self-pay.  [facility] [place] [address] [place] Phone: [phone]   [facility] [address] Natasha Bence [place], [place] [phone], Ext. [phone] Mondays & Thursdays: 7-9 AM.  First 15 patients are seen on a first come, first serve basis.    Medicaid-accepting [facility] Providers:  Organization         Address  Phone   Notes  [facility] [address], [place] [phone] Also accepts self-pay patients.  [facility] [address] Laurell Josephs [place], [place]  [phone]   [facility] [address], [place] [phone]   [facility] [address] [place] [phone]   Renaye Rakers [address], [place]   [phone] Only accepts [place] patients after they have their name applied to their card.   Self-Pay (no insurance) in [facility]:  Organization         Address  Phone   Notes  Sickle Cell Patients, [facility] [address] [place], [place] [phone]   [facility] [address] [place], [place] [phone]   [facility] [place]  [address], [place] [phone]   [facility]/Dr. Osei-Bonsu  [address], [place] or [address], [place] [phone] Phone number for both [place] and [place] locations is the  same.  [facility] [address], [place] [phone]   [facility] [address] [place] or [address] [phone]   [facility] [address] [place] [phone], phone; [phone], fax Sees patients 1st and 3rd Saturday of every month.  Must not qualify for public or private insurance (i.e. Medicaid, Medicare, [place] Health Choice, Veterans' Benefits)  Household income should be no more than 200% of the poverty level The clinic cannot treat you if you are pregnant or think you are pregnant  Sexually transmitted diseases are not treated at the clinic.    Dental Care: Organization         Address  Phone  Notes  [facility] Department of [facility] [address] [place], [place] [phone] Accepts children up to age 77 who are enrolled in [place] or [place] Health Choice; pregnant women with a Medicaid card; and children who have applied for Medicaid or [place] Health Choice, but were declined, whose parents can pay a reduced fee at time of service.  [facility] Department of [facility]  [address], [place] [phone] Accepts children up to age 38 who are enrolled in [place] or [place] Health Choice; pregnant women with a Medicaid card; and children who have applied for Medicaid or [place] Health Choice, but were declined, whose parents can pay a reduced fee at time of service.  [facility]  [address] [place], [place] [phone] Patients are seen by appointment only. Walk-ins are not accepted. [facility] will see patients 62 years of age and older. Monday - Tuesday (8am-5pm) Most Wednesdays (8:30-5pm) $30 per visit, cash only  [facility] Adult Dental Access PRO[facility]  [address], [facility] [phone] Patients are seen by appointment only. Walk-ins are not accepted. [facility] will  see patients  51 years of age and older. One Wednesday Evening (Monthly: Volunteer Based).  $30 per visit, cash only  [employer]  [phone] for adults; Children under age 39, call Graduate Pediatric Dentistry at [phone]. Children aged 57-14, please call [phone] to request a pediatric application.  Dental services are provided in all areas of dental care including fillings, crowns and bridges, complete and partial dentures, implants, gum treatment, root canals, and extractions. Preventive care is also provided. Treatment is provided to both adults and children. Patients are selected via a lottery and there is often a waiting list.   [facility] [address], [place]  [phone] [email or web address]   [facility] [address] [place], [place] [phone], Ext. [phone] Second and Fourth Thursday of each month, opens at 6:30 AM; Clinic ends at 9 AM.  Patients are seen on a first-come first-served basis, and a limited number are seen during each clinic.   [facility]  [address] Ether Griffins [place], [place] [phone]   Eligibility Requirements You must have lived in [place], [place], or [place] counties for at least the last three months.   You cannot be eligible for state or federal sponsored [place], including [employer], [place], or [employer].   You generally cannot be eligible for healthcare insurance through your employer.    How to apply: Eligibility screenings are held every Tuesday and Wednesday afternoon from 1:00 pm until 4:00 pm. You do not need an appointment for the interview!  [facility] [address] [place], [place] [phone]   [facility] Health Department  [phone]   [facility] Health Department  [phone]   [facility] Health Department  [phone]    Behavioral Health Resources in the Community: Intensive Outpatient  Programs Organization         Address  Phone  Notes  [facility] Services [address] [employer], [place] [phone]   [facility] Outpatient [address], [place], [place] [phone]   [facility] [address], [place], [place]  [phone]   [facility] [address]  [place], [place] [phone] or [phone]   Substance Abuse Resources Organization         Address  Phone  Notes  [facility]  [phone]   [facility]  [phone]   [facility] [place]  [phone]   Floydene Flock  [phone]   Residential & Outpatient Substance Abuse Program  [phone]   Psychological Services Organization         Address  Phone  Notes  [facility]al Health  [phone]   [facility] Services  [phone]   [facility] Mental Health [address] [place] [phone] or [phone]    Mobile Crisis Teams Organization         Address  Phone  Notes  [facility], Mobile Crisis Care Unit  [phone]   [facility]  [address] [place], [place] [phone]   Doristine Locks [address] [place] [phone]    Self-Help/Support Groups Organization         Address  Phone             Notes  [facility] [place] - variety of support groups  336- I7437963 Call for more information  Narcotics Anonymous (NA), [facility] [address],  [place]  2 meetings at this location   Residential Treatment Programs Organization         Address  Phone  Notes  [facility] [address],    [place]  [phone]   [facility]  [address], [place] [phone], [place], [place] [phone]   [facility] [address], [place] [phone] Admissions: 8am-3pm M-F  [facility] [address]    [place], [place]  [phone]   [facility] [address], [place], [place] [phone]   [facility] [address]  [place], [place] [phone]   [facility] - Intensive Outpatient [address], [place], [place] [phone]   [facility] ([facility] [address]  [place], [place] [phone] or [phone]   [facility] ([facility]) [address] [place], [place] [phone] Accepts Medicaid  [facility] [address]  [place] [phone] Substance Abuse/Addiction Treatment   [facility] Behavioral Health Resources Organization         Address  Phone  Notes  [facility]  [phone]) [phone]   Angie FavaJulie Brannon, PhD [address] [place], [place]   [phone](336) [phone] or [phone]) [phone]   [facility]   [address] [place], [place] [phone](336) [phone]   [facility] [address], [place], [place] [phone]) [phone] Insurance/Medicaid/sponsorship through [facility]  [facility] [address]                                    [place], [place] [phone]) [phone] Therapy/tele-psych/case  [facility] [address]   [place], [place] [phone]) [phone]    Dr. Lolly MustacheArfeen  [phone]) [phone]   [facility] [place]  [employer] [facility] 1) [address], [place] 2) [address], [place] 3)  [address], [place] [phone]  [phone](336) [phone]   [facility] Child Abuse Hotline [phone](336) [phone] or [phone](336) [phone] (After Hours)

## 2013-06-19 NOTE — ED Notes (Signed)
Patient with chronic back pain from an old injury.  Patient states that it has been getting worse in the last two weeks.  Patient states he had been taking meds that was given to him last week when he was seen.

## 2013-06-22 ENCOUNTER — Other Ambulatory Visit (HOSPITAL_COMMUNITY): Payer: Self-pay | Admitting: Orthopaedic Surgery

## 2013-06-22 DIAGNOSIS — M545 Low back pain, unspecified: Secondary | ICD-10-CM

## 2013-06-22 DIAGNOSIS — M5410 Radiculopathy, site unspecified: Secondary | ICD-10-CM

## 2013-06-22 NOTE — ED Provider Notes (Signed)
Medical screening examination/treatment/procedure(s) were performed by non-physician practitioner and as supervising physician I was immediately available for consultation/collaboration.   EKG Interpretation None        Kimmora Risenhoover, MD 06/22/13 1118 

## 2013-06-28 ENCOUNTER — Ambulatory Visit (HOSPITAL_COMMUNITY)
Admission: RE | Admit: 2013-06-28 | Discharge: 2013-06-28 | Disposition: A | Discharge status: Home / Self Care | Payer: Medicaid Other | Source: Ambulatory Visit | Attending: Orthopaedic Surgery | Admitting: Orthopaedic Surgery

## 2013-06-28 DIAGNOSIS — M5410 Radiculopathy, site unspecified: Secondary | ICD-10-CM

## 2013-06-28 DIAGNOSIS — M5137 Other intervertebral disc degeneration, lumbosacral region: Secondary | ICD-10-CM | POA: Insufficient documentation

## 2013-06-28 DIAGNOSIS — M129 Arthropathy, unspecified: Secondary | ICD-10-CM | POA: Insufficient documentation

## 2013-06-28 DIAGNOSIS — M5126 Other intervertebral disc displacement, lumbar region: Secondary | ICD-10-CM | POA: Insufficient documentation

## 2013-06-28 DIAGNOSIS — M545 Low back pain, unspecified: Secondary | ICD-10-CM | POA: Insufficient documentation

## 2013-06-28 DIAGNOSIS — M51379 Other intervertebral disc degeneration, lumbosacral region without mention of lumbar back pain or lower extremity pain: Secondary | ICD-10-CM | POA: Insufficient documentation

## 2013-07-04 ENCOUNTER — Encounter (HOSPITAL_COMMUNITY): Payer: Self-pay | Admitting: Pharmacy Technician

## 2013-07-04 ENCOUNTER — Other Ambulatory Visit: Payer: Self-pay | Admitting: Orthopedic Surgery

## 2013-07-05 ENCOUNTER — Ambulatory Visit (HOSPITAL_COMMUNITY): Admission: RE | Admit: 2013-07-05 | Payer: Self-pay | Source: Ambulatory Visit

## 2013-07-05 ENCOUNTER — Encounter (HOSPITAL_COMMUNITY)
Admission: RE | Admit: 2013-07-05 | Discharge: 2013-07-05 | Disposition: A | Discharge status: Home / Self Care | Payer: Self-pay | Source: Ambulatory Visit | Attending: Orthopedic Surgery | Admitting: Orthopedic Surgery

## 2013-07-05 ENCOUNTER — Encounter (HOSPITAL_COMMUNITY): Payer: Self-pay

## 2013-07-05 DIAGNOSIS — Z01812 Encounter for preprocedural laboratory examination: Secondary | ICD-10-CM | POA: Insufficient documentation

## 2013-07-05 DIAGNOSIS — Z01810 Encounter for preprocedural cardiovascular examination: Secondary | ICD-10-CM | POA: Insufficient documentation

## 2013-07-05 LAB — CBC WITH DIFFERENTIAL/PLATELET
BASOS ABS: 0 10*3/uL (ref 0.0–0.1)
Basophils Relative: 0 % (ref 0–1)
EOS PCT: 3 % (ref 0–5)
Eosinophils Absolute: 0.2 10*3/uL (ref 0.0–0.7)
HEMATOCRIT: 44.6 % (ref 39.0–52.0)
Hemoglobin: 15.8 g/dL (ref 13.0–17.0)
LYMPHS PCT: 21 % (ref 12–46)
Lymphs Abs: 1.6 10*3/uL (ref 0.7–4.0)
MCH: 32.7 pg (ref 26.0–34.0)
MCHC: 35.4 g/dL (ref 30.0–36.0)
MCV: 92.3 fL (ref 78.0–100.0)
MONO ABS: 0.3 10*3/uL (ref 0.1–1.0)
Monocytes Relative: 4 % (ref 3–12)
Neutro Abs: 5.4 10*3/uL (ref 1.7–7.7)
Neutrophils Relative %: 72 % (ref 43–77)
Platelets: 164 10*3/uL (ref 150–400)
RBC: 4.83 MIL/uL (ref 4.22–5.81)
RDW: 12.1 % (ref 11.5–15.5)
WBC: 7.6 10*3/uL (ref 4.0–10.5)

## 2013-07-05 LAB — APTT: aPTT: 34 seconds (ref 24–37)

## 2013-07-05 LAB — ABO/RH: ABO/RH(D): A NEG

## 2013-07-05 LAB — TYPE AND SCREEN
ABO/RH(D): A NEG
Antibody Screen: NEGATIVE

## 2013-07-05 LAB — COMPREHENSIVE METABOLIC PANEL
ALT: 22 U/L (ref 0–53)
AST: 33 U/L (ref 0–37)
Albumin: 4.1 g/dL (ref 3.5–5.2)
Alkaline Phosphatase: 60 U/L (ref 39–117)
BUN: 16 mg/dL (ref 6–23)
CO2: 30 meq/L (ref 19–32)
CREATININE: 1.12 mg/dL (ref 0.50–1.35)
Calcium: 9.4 mg/dL (ref 8.4–10.5)
Chloride: 100 mEq/L (ref 96–112)
GFR, EST NON AFRICAN AMERICAN: 82 mL/min — AB (ref >90)
Glucose, Bld: 67 mg/dL — ABNORMAL LOW (ref 70–99)
Potassium: 4.2 mEq/L (ref 3.7–5.3)
Sodium: 142 mEq/L (ref 137–147)
Total Bilirubin: 0.3 mg/dL (ref 0.3–1.2)
Total Protein: 6.8 g/dL (ref 6.0–8.3)

## 2013-07-05 LAB — URINALYSIS, ROUTINE W REFLEX MICROSCOPIC
BILIRUBIN URINE: NEGATIVE
Glucose, UA: NEGATIVE mg/dL
Hgb urine dipstick: NEGATIVE
Ketones, ur: NEGATIVE mg/dL
Nitrite: NEGATIVE
PROTEIN: NEGATIVE mg/dL
Specific Gravity, Urine: 1.034 — ABNORMAL HIGH (ref 1.005–1.030)
Urobilinogen, UA: 1 mg/dL (ref 0.0–1.0)
pH: 5.5 (ref 5.0–8.0)

## 2013-07-05 LAB — URINE MICROSCOPIC-ADD ON

## 2013-07-05 LAB — PROTIME-INR
INR: 1.02 (ref 0.00–1.49)
Prothrombin Time: 13.4 seconds (ref 11.6–15.2)

## 2013-07-05 LAB — SURGICAL PCR SCREEN
MRSA, PCR: NEGATIVE
Staphylococcus aureus: NEGATIVE

## 2013-07-05 MED ORDER — CEFAZOLIN SODIUM-DEXTROSE 2-3 GM-% IV SOLR
2.0000 g | INTRAVENOUS | Status: AC
  Administered 2013-07-06: 2 g via INTRAVENOUS

## 2013-07-05 NOTE — Pre-Procedure Instructions (Signed)
Mitchell Long  07/05/2013   Your procedure is scheduled on:  Thursday, July 25  [facility] Ad[facility] at          . ( Call the morning of surgery to verify arrival time  Call this number if you have problems the morning of surgery: [phone]   Remember:   Do not eat food or drink liquids after midnight.tonight    Take these medicines the morning of surgery with A SIP OF WATER: pain medication as needed   Do not wear jewelry.  Do not wear lotions, powders, or perfumes. You may wear deodorant.  Do not shave 48 hours prior to surgery. Men may shave face and neck.  Do not bring valuables to the hospital.  [facility] not responsible.  for any belongings or valuables.               Contacts, dentures or bridgework may not be worn into surgery.  Leave suitcase in the car. After surgery it may be brought to your room.  For patients admitted to the hospital, discharge time is determined by your.  treatment team.  .   Special Instructions: [place] - Preparing for Surgery  Before surgery, you can play an important role.  Because skin is not sterile, your skin needs to be as free of germs as possible.  You can reduce the number of germs on you skin by washing with CHG (chlorahexidine gluconate) soap before surgery.  CHG is an antiseptic cleaner which kills germs and bonds with the skin to continue killing germs even after washing.  Please DO NOT use if you have an allergy to CHG or antibacterial soaps.  If your skin becomes reddened/irritated stop using the CHG and inform your nurse when you arrive at Short Stay.  Do not shave (including legs and underarms) for at least 48 hours prior to the first CHG shower.  You may shave your face.  Please follow these instructions carefully:   1.  Shower with CHG Soap the night before surgery and the  morning of Surgery.  2.  If you choose to wash your hair, wash your hair first as usual with normal shampoo.  3.  After you  shampoo, rinse your hair and body thoroughly to remove the Shampoo.  4.  Use CHG as you would any other liquid soap.  You can apply chg directly to the skin and wash gently with scrungie or a clean washcloth.  5.  Apply the CHG Soap to your body ONLY FROM THE NECK DOWN.  Do not use on open wounds or open sores.  Avoid contact with your eyes, ears, mouth and genitals (private parts).  Wash genitals (private parts)  with your normal soap.  6.  Wash thoroughly, paying special attention to the area where your surgery  will be performed.  7.  Thoroughly rinse your body with warm water from the neck down.  8.  DO NOT shower/wash with your normal soap after using and rinsing off   the CHG Soap.  9.  Pat yourself dry with a clean towel.            10.  Wear clean pajamas.            11.  Place clean sheets on your bed the night of your first shower and do not sleep with pets.  Day of Surgery  Do not apply any lotions/deoderants the morning of surgery.  Please  wear clean clothes to the hospital/surgery center.     Please read over the following fact sheets that you were given: Pain Booklet, Coughing and Deep Breathing and Surgical Site Infection Prevention

## 2013-07-06 ENCOUNTER — Ambulatory Visit (HOSPITAL_COMMUNITY): Payer: Self-pay

## 2013-07-06 ENCOUNTER — Encounter (HOSPITAL_COMMUNITY): Payer: Self-pay | Admitting: Anesthesiology

## 2013-07-06 ENCOUNTER — Ambulatory Visit (HOSPITAL_COMMUNITY): Payer: Self-pay | Admitting: Anesthesiology

## 2013-07-06 ENCOUNTER — Ambulatory Visit (HOSPITAL_COMMUNITY): Admission: RE | Admit: 2013-07-06 | Payer: Medicaid Other | Source: Ambulatory Visit | Admitting: Orthopedic Surgery

## 2013-07-06 ENCOUNTER — Encounter (HOSPITAL_COMMUNITY): Admission: RE | Payer: Self-pay | Source: Ambulatory Visit

## 2013-07-06 ENCOUNTER — Ambulatory Visit (HOSPITAL_COMMUNITY)
Admission: RE | Admit: 2013-07-06 | Discharge: 2013-07-06 | Disposition: A | Discharge status: Home / Self Care | Payer: Self-pay | Source: Ambulatory Visit | Attending: Orthopedic Surgery | Admitting: Orthopedic Surgery

## 2013-07-06 ENCOUNTER — Encounter (HOSPITAL_COMMUNITY)
Admission: RE | Disposition: A | Discharge status: Home / Self Care | Payer: Medicaid Other | Source: Ambulatory Visit | Attending: Orthopedic Surgery

## 2013-07-06 DIAGNOSIS — M5126 Other intervertebral disc displacement, lumbar region: Secondary | ICD-10-CM | POA: Insufficient documentation

## 2013-07-06 DIAGNOSIS — F17200 Nicotine dependence, unspecified, uncomplicated: Secondary | ICD-10-CM | POA: Insufficient documentation

## 2013-07-06 HISTORY — PX: LUMBAR LAMINECTOMY/DECOMPRESSION MICRODISCECTOMY: SHX5026

## 2013-07-06 SURGERY — LUMBAR LAMINECTOMY/DECOMPRESSION MICRODISCECTOMY
Anesthesia: General | Laterality: Right

## 2013-07-06 SURGERY — LUMBAR LAMINECTOMY/DECOMPRESSION MICRODISCECTOMY
Anesthesia: General | Site: Back | Laterality: Right

## 2013-07-06 MED ORDER — ONDANSETRON HCL 4 MG/2ML IJ SOLN
INTRAMUSCULAR | Status: AC
  Filled 2013-07-06: qty 2

## 2013-07-06 MED ORDER — POVIDONE-IODINE 7.5 % EX SOLN: Freq: Once | CUTANEOUS | Status: DC

## 2013-07-06 MED ORDER — FENTANYL CITRATE 0.05 MG/ML IJ SOLN
25.0000 ug | INTRAMUSCULAR | Status: DC | PRN
  Administered 2013-07-06 (×3): 50 ug via INTRAVENOUS

## 2013-07-06 MED ORDER — DIAZEPAM 5 MG PO TABS
5.0000 mg | ORAL_TABLET | Freq: Once | ORAL | Status: AC
  Administered 2013-07-06: 5 mg via ORAL

## 2013-07-06 MED ORDER — SUCCINYLCHOLINE CHLORIDE 20 MG/ML IJ SOLN
INTRAMUSCULAR | Status: AC
  Filled 2013-07-06: qty 1

## 2013-07-06 MED ORDER — OXYCODONE-ACETAMINOPHEN 5-325 MG PO TABS
ORAL_TABLET | ORAL | Status: AC
  Filled 2013-07-06: qty 1

## 2013-07-06 MED ORDER — ARTIFICIAL TEARS OP OINT
TOPICAL_OINTMENT | OPHTHALMIC | Status: DC | PRN
  Administered 2013-07-06: 1 via OPHTHALMIC

## 2013-07-06 MED ORDER — LACTATED RINGERS IV SOLN
INTRAVENOUS | Status: DC | PRN
  Administered 2013-07-06 (×2): via INTRAVENOUS

## 2013-07-06 MED ORDER — OXYCODONE-ACETAMINOPHEN 5-325 MG PO TABS
1.0000 | ORAL_TABLET | Freq: Once | ORAL | Status: AC
  Administered 2013-07-06: 1 via ORAL

## 2013-07-06 MED ORDER — ACETAMINOPHEN 10 MG/ML IV SOLN
1000.0000 mg | INTRAVENOUS | Status: AC
  Administered 2013-07-06: 1000 mg via INTRAVENOUS
  Filled 2013-07-06: qty 100

## 2013-07-06 MED ORDER — THROMBIN 20000 UNITS EX SOLR
CUTANEOUS | Status: AC
  Filled 2013-07-06: qty 20000

## 2013-07-06 MED ORDER — GLYCOPYRROLATE 0.2 MG/ML IJ SOLN
INTRAMUSCULAR | Status: AC
  Filled 2013-07-06: qty 2

## 2013-07-06 MED ORDER — GLYCOPYRROLATE 0.2 MG/ML IJ SOLN
INTRAMUSCULAR | Status: AC
  Filled 2013-07-06: qty 1

## 2013-07-06 MED ORDER — ONDANSETRON HCL 4 MG/2ML IJ SOLN
INTRAMUSCULAR | Status: DC | PRN
  Administered 2013-07-06: 4 mg via INTRAVENOUS

## 2013-07-06 MED ORDER — THROMBIN 20000 UNITS EX SOLR
CUTANEOUS | Status: DC | PRN
  Administered 2013-07-06: 12:00:00 via TOPICAL

## 2013-07-06 MED ORDER — NEOSTIGMINE METHYLSULFATE 10 MG/10ML IV SOLN
INTRAVENOUS | Status: DC | PRN
  Administered 2013-07-06: 3 mg via INTRAVENOUS

## 2013-07-06 MED ORDER — FENTANYL CITRATE 0.05 MG/ML IJ SOLN
INTRAMUSCULAR | Status: AC
  Filled 2013-07-06: qty 2

## 2013-07-06 MED ORDER — ROCURONIUM BROMIDE 100 MG/10ML IV SOLN
INTRAVENOUS | Status: DC | PRN
  Administered 2013-07-06: 50 mg via INTRAVENOUS

## 2013-07-06 MED ORDER — THROMBIN 20000 UNITS EX KIT
PACK | CUTANEOUS | Status: DC | PRN
  Administered 2013-07-06: 20000 [IU] via TOPICAL

## 2013-07-06 MED ORDER — MIDAZOLAM HCL 5 MG/5ML IJ SOLN
INTRAMUSCULAR | Status: DC | PRN
  Administered 2013-07-06: 2 mg via INTRAVENOUS

## 2013-07-06 MED ORDER — BUPIVACAINE-EPINEPHRINE (PF) 0.25% -1:200000 IJ SOLN
INTRAMUSCULAR | Status: AC
  Filled 2013-07-06: qty 30

## 2013-07-06 MED ORDER — DIAZEPAM 5 MG PO TABS
ORAL_TABLET | ORAL | Status: AC
  Filled 2013-07-06: qty 1

## 2013-07-06 MED ORDER — PROPOFOL 10 MG/ML IV BOLUS
INTRAVENOUS | Status: AC
  Filled 2013-07-06: qty 20

## 2013-07-06 MED ORDER — ROCURONIUM BROMIDE 50 MG/5ML IV SOLN
INTRAVENOUS | Status: AC
  Filled 2013-07-06: qty 1

## 2013-07-06 MED ORDER — NEOSTIGMINE METHYLSULFATE 10 MG/10ML IV SOLN
INTRAVENOUS | Status: AC
  Filled 2013-07-06: qty 1

## 2013-07-06 MED ORDER — LACTATED RINGERS IV SOLN
INTRAVENOUS | Status: DC
  Administered 2013-07-06: 50 mL/h via INTRAVENOUS

## 2013-07-06 MED ORDER — FENTANYL CITRATE 0.05 MG/ML IJ SOLN
INTRAMUSCULAR | Status: DC
Start: 2013-07-06 — End: 2013-07-06
  Filled 2013-07-06: qty 2

## 2013-07-06 MED ORDER — METHYLENE BLUE 1 % INJ SOLN
INTRAMUSCULAR | Status: AC
  Filled 2013-07-06: qty 10

## 2013-07-06 MED ORDER — LIDOCAINE HCL (CARDIAC) 20 MG/ML IV SOLN
INTRAVENOUS | Status: DC | PRN
  Administered 2013-07-06: 80 mg via INTRAVENOUS

## 2013-07-06 MED ORDER — CEFAZOLIN SODIUM-DEXTROSE 2-3 GM-% IV SOLR: 2.0000 g | INTRAVENOUS | Status: DC

## 2013-07-06 MED ORDER — INDIGOTINDISULFONATE SODIUM 8 MG/ML IJ SOLN
INTRAMUSCULAR | Status: DC | PRN
  Administered 2013-07-06: 1 mL via INTRAVENOUS

## 2013-07-06 MED ORDER — GLYCOPYRROLATE 0.2 MG/ML IJ SOLN
INTRAMUSCULAR | Status: DC | PRN
  Administered 2013-07-06: 0.4 mg via INTRAVENOUS

## 2013-07-06 MED ORDER — METHYLPREDNISOLONE ACETATE 40 MG/ML IJ SUSP
INTRAMUSCULAR | Status: AC
  Filled 2013-07-06: qty 1

## 2013-07-06 MED ORDER — POVIDONE-IODINE 7.5 % EX SOLN
Freq: Once | CUTANEOUS | Status: DC
  Filled 2013-07-06: qty 118

## 2013-07-06 MED ORDER — BUPIVACAINE-EPINEPHRINE 0.25% -1:200000 IJ SOLN
INTRAMUSCULAR | Status: DC | PRN
  Administered 2013-07-06: 20 mL
  Administered 2013-07-06: 6 mL

## 2013-07-06 MED ORDER — MIDAZOLAM HCL 2 MG/2ML IJ SOLN
INTRAMUSCULAR | Status: AC
  Filled 2013-07-06: qty 2

## 2013-07-06 MED ORDER — LIDOCAINE HCL (CARDIAC) 20 MG/ML IV SOLN
INTRAVENOUS | Status: AC
  Filled 2013-07-06: qty 10

## 2013-07-06 MED ORDER — FENTANYL CITRATE 0.05 MG/ML IJ SOLN
INTRAMUSCULAR | Status: DC | PRN
  Administered 2013-07-06 (×5): 50 ug via INTRAVENOUS

## 2013-07-06 MED ORDER — PROPOFOL 10 MG/ML IV BOLUS
INTRAVENOUS | Status: DC | PRN
  Administered 2013-07-06: 200 mg via INTRAVENOUS

## 2013-07-06 MED ORDER — THROMBIN 20000 UNITS EX KIT
PACK | CUTANEOUS | Status: AC
  Filled 2013-07-06: qty 1

## 2013-07-06 MED ORDER — FENTANYL CITRATE 0.05 MG/ML IJ SOLN
INTRAMUSCULAR | Status: AC
  Filled 2013-07-06: qty 5

## 2013-07-06 SURGICAL SUPPLY — 72 items
BENZOIN TINCTURE PRP APPL 2/3 (GAUZE/BANDAGES/DRESSINGS) ×3 IMPLANT
BUR ROUND PRECISION 4.0 (BURR) ×2 IMPLANT
BUR ROUND PRECISION 4.0MM (BURR) ×1
CANISTER SUCTION 2500CC (MISCELLANEOUS) ×3 IMPLANT
CARTRIDGE OIL MAESTRO DRILL (MISCELLANEOUS) ×1 IMPLANT
CLOSURE STERI-STRIP 1/4X4 (GAUZE/BANDAGES/DRESSINGS) ×3 IMPLANT
CLOSURE WOUND 1/2 X4 (GAUZE/BANDAGES/DRESSINGS)
CORDS BIPOLAR (ELECTRODE) ×3 IMPLANT
COVER SURGICAL LIGHT HANDLE (MISCELLANEOUS) ×3 IMPLANT
DIFFUSER DRILL AIR PNEUMATIC (MISCELLANEOUS) ×3 IMPLANT
DRAIN CHANNEL 15F RND FF W/TCR (WOUND CARE) IMPLANT
DRAPE POUCH INSTRU U-SHP 10X18 (DRAPES) ×6 IMPLANT
DRAPE SURG 17X23 STRL (DRAPES) ×12 IMPLANT
DURAPREP 26ML APPLICATOR (WOUND CARE) ×3 IMPLANT
ELECT BLADE 4.0 EZ CLEAN MEGAD (MISCELLANEOUS)
ELECT CAUTERY BLADE 6.4 (BLADE) ×3 IMPLANT
ELECT REM PT RETURN 9FT ADLT (ELECTROSURGICAL) ×3
ELECTRODE BLDE 4.0 EZ CLN MEGD (MISCELLANEOUS) IMPLANT
ELECTRODE REM PT RTRN 9FT ADLT (ELECTROSURGICAL) ×1 IMPLANT
EVACUATOR SILICONE 100CC (DRAIN) IMPLANT
FILTER STRAW FLUID ASPIR (MISCELLANEOUS) ×3 IMPLANT
GAUZE SPONGE 4X4 16PLY XRAY LF (GAUZE/BANDAGES/DRESSINGS) ×6 IMPLANT
GLOVE BIO SURGEON STRL SZ7 (GLOVE) ×3 IMPLANT
GLOVE BIO SURGEON STRL SZ8 (GLOVE) ×3 IMPLANT
GLOVE BIOGEL PI IND STRL 7.0 (GLOVE) ×1 IMPLANT
GLOVE BIOGEL PI IND STRL 8 (GLOVE) ×1 IMPLANT
GLOVE BIOGEL PI INDICATOR 7.0 (GLOVE) ×2
GLOVE BIOGEL PI INDICATOR 8 (GLOVE) ×2
GOWN STRL REUS W/ TWL LRG LVL3 (GOWN DISPOSABLE) ×1 IMPLANT
GOWN STRL REUS W/ TWL XL LVL3 (GOWN DISPOSABLE) ×2 IMPLANT
GOWN STRL REUS W/TWL LRG LVL3 (GOWN DISPOSABLE) ×2
GOWN STRL REUS W/TWL XL LVL3 (GOWN DISPOSABLE) ×4
IV CATH 14GX2 1/4 (CATHETERS) ×3 IMPLANT
KIT BASIN OR (CUSTOM PROCEDURE TRAY) ×3 IMPLANT
KIT POSITION SURG JACKSON T1 (MISCELLANEOUS) ×3 IMPLANT
KIT ROOM TURNOVER OR (KITS) ×3 IMPLANT
NEEDLE 18GX1X1/2 (RX/OR ONLY) (NEEDLE) ×3 IMPLANT
NEEDLE 22X1 1/2 (OR ONLY) (NEEDLE) ×3 IMPLANT
NEEDLE HYPO 25GX1X1/2 BEV (NEEDLE) ×3 IMPLANT
NEEDLE SPNL 18GX3.5 QUINCKE PK (NEEDLE) ×6 IMPLANT
NS IRRIG 1000ML POUR BTL (IV SOLUTION) ×3 IMPLANT
OIL CARTRIDGE MAESTRO DRILL (MISCELLANEOUS) ×3
PACK LAMINECTOMY ORTHO (CUSTOM PROCEDURE TRAY) ×3 IMPLANT
PACK UNIVERSAL I (CUSTOM PROCEDURE TRAY) ×3 IMPLANT
PAD ARMBOARD 7.5X6 YLW CONV (MISCELLANEOUS) ×6 IMPLANT
PATTIES SURGICAL .5 X.5 (GAUZE/BANDAGES/DRESSINGS) IMPLANT
PATTIES SURGICAL .5 X1 (DISPOSABLE) ×3 IMPLANT
SPONGE GAUZE 4X4 12PLY (GAUZE/BANDAGES/DRESSINGS) ×3 IMPLANT
SPONGE INTESTINAL PEANUT (DISPOSABLE) ×3 IMPLANT
SPONGE SURGIFOAM ABS GEL 100 (HEMOSTASIS) ×3 IMPLANT
SPONGE SURGIFOAM ABS GEL SZ50 (HEMOSTASIS) ×3 IMPLANT
STRIP CLOSURE SKIN 1/2X4 (GAUZE/BANDAGES/DRESSINGS) IMPLANT
SURGIFLO TRUKIT (HEMOSTASIS) IMPLANT
SURGIFLO W/THROMBIN 8M KIT (HEMOSTASIS) ×3 IMPLANT
SUT MNCRL AB 4-0 PS2 18 (SUTURE) ×3 IMPLANT
SUT VIC AB 0 CT1 18XCR BRD 8 (SUTURE) IMPLANT
SUT VIC AB 0 CT1 27 (SUTURE)
SUT VIC AB 0 CT1 27XBRD ANBCTR (SUTURE) IMPLANT
SUT VIC AB 0 CT1 8-18 (SUTURE)
SUT VIC AB 1 CT1 18XCR BRD 8 (SUTURE) ×1 IMPLANT
SUT VIC AB 1 CT1 8-18 (SUTURE) ×2
SUT VIC AB 2-0 CT2 18 VCP726D (SUTURE) ×3 IMPLANT
SYR 20CC LL (SYRINGE) IMPLANT
SYR BULB IRRIGATION 50ML (SYRINGE) ×3 IMPLANT
SYR CONTROL 10ML LL (SYRINGE) ×6 IMPLANT
SYR TB 1ML 26GX3/8 SAFETY (SYRINGE) ×6 IMPLANT
SYR TB 1ML LUER SLIP (SYRINGE) ×6 IMPLANT
TAPE CLOTH SURG 4X10 WHT LF (GAUZE/BANDAGES/DRESSINGS) ×3 IMPLANT
TOWEL OR 17X24 6PK STRL BLUE (TOWEL DISPOSABLE) ×3 IMPLANT
TOWEL OR 17X26 10 PK STRL BLUE (TOWEL DISPOSABLE) ×3 IMPLANT
WATER STERILE IRR 1000ML POUR (IV SOLUTION) ×3 IMPLANT
YANKAUER SUCT BULB TIP NO VENT (SUCTIONS) ×3 IMPLANT

## 2013-07-06 NOTE — Transfer of Care (Signed)
Immediate Anesthesia Transfer of Care Note  Patient: Mitchell CootsAllen C Clubb  Procedure(s) Performed: Procedure(s): RIGHT SIDED LUMBAR 4-5 MICRODISCECTOMY    (1 LEVEL) (Right)  Patient Location: PACU  Anesthesia Type:General  Level of Consciousness: awake, alert , oriented and patient cooperative  Airway & Oxygen Therapy: Patient Spontanous Breathing and Patient connected to nasal cannula oxygen  Post-op Assessment: Report given to PACU RN and Post -op Vital signs reviewed and stable  Post vital signs: Reviewed  Complications: No apparent anesthesia complications

## 2013-07-06 NOTE — Anesthesia Procedure Notes (Signed)
Procedure Name: Intubation Date/Time: 07/06/2013 10:50 AM Performed by: Lovie CholOCK, JENNIFER K Pre-anesthesia Checklist: Patient identified, Emergency Drugs available, Suction available, Patient being monitored and Timeout performed Patient Re-evaluated:Patient Re-evaluated prior to inductionOxygen Delivery Method: Circle system utilized Preoxygenation: Pre-oxygenation with 100% oxygen Intubation Type: IV induction Laryngoscope Size: Miller and 3 Grade View: Grade I Tube type: Oral Tube size: 7.5 mm Airway Equipment and Method: Stylet Placement Confirmation: ETT inserted through vocal cords under direct vision,  positive ETCO2,  CO2 detector and breath sounds checked- equal and bilateral Secured at: 21 cm Tube secured with: Tape Dental Injury: Teeth and Oropharynx as per pre-operative assessment  Comments: Soft bite block in place

## 2013-07-06 NOTE — Anesthesia Preprocedure Evaluation (Addendum)
Anesthesia Evaluation  Patient identified by MRN, date of birth, ID band Patient awake    Reviewed: Allergy & Precautions, H&P , NPO status , Patient's Chart, lab work & pertinent test results  History of Anesthesia Complications Negative for: history of anesthetic complications  Airway Mallampati: II TM Distance: >3 FB Neck ROM: Full    Dental  (+) Teeth Intact, Dental Advisory Given   Pulmonary Current Smoker,    + decreased breath sounds      Cardiovascular negative cardio ROS  Rhythm:Regular Rate:Normal     Neuro/Psych negative neurological ROS  negative psych ROS   GI/Hepatic negative GI ROS, Neg liver ROS,   Endo/Other  negative endocrine ROS  Renal/GU negative Renal ROS     Musculoskeletal   Abdominal   Peds  Hematology   Anesthesia Other Findings   Reproductive/Obstetrics                          Anesthesia Physical Anesthesia Plan  ASA: II  Anesthesia Plan: General   Post-op Pain Management:    Induction: Intravenous  Airway Management Planned: Oral ETT  Additional Equipment:   Intra-op Plan:   Post-operative Plan: Extubation in OR  Informed Consent: I have reviewed the patients History and Physical, chart, labs and discussed the procedure including the risks, benefits and alternatives for the proposed anesthesia with the patient or authorized representative who has indicated his/her understanding and acceptance.   Dental advisory given  Plan Discussed with: CRNA, Anesthesiologist and Surgeon  Anesthesia Plan Comments:         Anesthesia Quick Evaluation

## 2013-07-06 NOTE — H&P (Signed)
     PREOPERATIVE H&P  Chief Complaint: right leg pain  HPI: Mitchell Long is a 38 y.o. male who presents with ongoing pain in the right leg  MRI reveals large L4/5 HNP  Patient has failed multiple forms of conservative care and continues to have pain (see office notes for additional details regarding the patient's full course of treatment)  Past Medical History  Diagnosis Date  . Back pain   . Back pain, chronic   . Medical history non-contributory   . MRSA (methicillin resistant staph aureus) culture positive 2010    cellulitis  rt elbow   Past Surgical History  Procedure Laterality Date  . No past surgeries     History   Social History  . Marital Status: Legally Separated    Spouse Name: N/A    Number of Children: N/A  . Years of Education: N/A   Social History Main Topics  . Smoking status: Current Every Day Smoker -- 1.00 packs/day for 20 years    Types: Cigarettes  . Smokeless tobacco: Never Used  . Alcohol Use: No  . Drug Use: No  . Sexual Activity: Not on file   Other Topics Concern  . Not on file   Social History Narrative  . No narrative on file   Family History  Problem Relation Age of Onset  . Other Father     diabetes   Allergies  Allergen Reactions  . Vicodin [Hydrocodone-Acetaminophen] Nausea Only   Prior to Admission medications   Medication Sig Start Date End Date Taking? Authorizing Provider  methocarbamol (ROBAXIN) 500 MG tablet Take 1 tablet (500 mg total) by mouth 2 (two) times daily. 06/19/13  Yes Marissa Sciacca, PA-C  oxyCODONE-acetaminophen (PERCOCET/ROXICET) 5-325 MG per tablet Take 1 tablet by mouth every 4 (four) hours as needed for moderate pain or severe pain.   Yes Historical Provider, MD     All other systems have been reviewed and were otherwise negative with the exception of those mentioned in the HPI and as above.  Physical Exam: There were no vitals filed for this visit.  General: Alert, no acute  distress Cardiovascular: No pedal edema Respiratory: No cyanosis, no use of accessory musculature Skin: No lesions in the area of chief complaint Neurologic: Sensation intact distally Psychiatric: Patient is competent for consent with normal mood and affect Lymphatic: No axillary or cervical lymphadenopathy  MUSCULOSKELETAL: + SLR on right  Assessment/Plan: Right leg pain Plan for Procedure(s): RIGHT SIDED LUMBAR 4-5 MICRODISCECTOMY    (1 LEVEL)   Emilee HeroUMONSKI,MARK LEONARD, MD 07/06/2013 6:36 AM

## 2013-07-06 NOTE — Discharge Instructions (Signed)

## 2013-07-06 NOTE — Anesthesia Postprocedure Evaluation (Signed)
  Anesthesia Post-op Note  Patient: Mitchell Long  Procedure(s) Performed: Procedure(s): RIGHT SIDED LUMBAR 4-5 MICRODISCECTOMY    (1 LEVEL) (Right)  Patient Location: PACU  Anesthesia Type:General  Level of Consciousness: awake  Airway and Oxygen Therapy: Patient Spontanous Breathing  Post-op Pain: mild  Post-op Assessment: Post-op Vital signs reviewed  Post-op Vital Signs: Reviewed  Last Vitals:  Filed Vitals:   07/06/13 1315  BP:   Pulse: 53  Temp:   Resp: 10    Complications: No apparent anesthesia complications

## 2013-07-07 ENCOUNTER — Encounter (HOSPITAL_COMMUNITY): Payer: Self-pay | Admitting: Orthopedic Surgery

## 2013-07-07 MED FILL — Thrombin For Soln 20000 Unit: CUTANEOUS | Qty: 1 | Status: AC

## 2013-07-07 NOTE — Op Note (Signed)
NAME:  Mitchell Long, Mitchell Long              ACCOUNT NO.:  [id number]  MEDICAL RECORD NO.:  [id number]  LOCATION:  PERIO                        FACILITY:  [facility]  PHYSICIAN:  Estill BambergMark Dumonski, MD      DATE OF BIRTH:  09-01-1975  DATE OF PROCEDURE:  07/06/2013                              OPERATIVE REPORT   PREOPERATIVE DIAGNOSES: 1. Severe right L5 radiculopathy. 2. Large right L4-5 disk herniation compressing the traversing L5     nerve.  POSTOPERATIVE DIAGNOSES: 1. Severe right L5 radiculopathy. 2. Large right L4-5 disk herniation compressing the traversing L5     nerve.  PROCEDURE:  Right L4-5 partial facetectomy with removal of large right L4-5 disk herniation.  SURGEON:  Estill BambergMark Dumonski, MD  ASSISTANT:  Jason CoopKayla McKenzie, PA-C  ANESTHESIA:  General endotracheal anesthesia.  COMPLICATIONS:  None.  DISPOSITION:  Stable.  ESTIMATED BLOOD LOSS:  Minimal.  INDICATIONS FOR PROCEDURE:  Briefly, [name] is a pleasant 38 year old male, who did present to me with severe debilitating pain in the right leg.  At the time of my evaluation with the patient, he was having pain in the right leg for approximately 1 month.  Of note, the patient did have a history of severe pain in the right leg 2 years prior.  An MRI from 2 years ago and a more recent MRI, did reveal a large right L4- 5 disk herniation.  Given his severe debilitating constant pain, we did discuss proceeding with the procedure reflected above.  OPERATIVE DETAILS:  On July 06, 2013, patient was brought to surgery and general endotracheal anesthesia was administered.  The patient was placed prone on a well-padded flat Jackson table with the spinal frame. Antibiotics were given and a time-out procedure was performed.  The back was prepped and draped in the usual sterile fashion.  A midline incision was made overlying the L4-5 interspace.  The paraspinal musculature on the right side was bluntly swept laterally.  A self-retaining  McCullough retractor was placed.  A lateral intraoperative radiograph did confirm the appropriate operative level.  I then performed a partial facetectomy on the right at the L4-5 level.  The ligamentum flavum on the right side was partially removed.  The traversing L5 nerve was identified and clearly noted to be under significant tension.  It was obvious that there was a large protruding disk fragment anterior to the nerve.  Of note, given the chronicity of the disk herniation, it was clear that there was clear that the disk herniation was very much adherent to the anterior aspect of the L5 nerve.  I was, however, able to meticulously to develop a plane between the nerve and the disk herniation.  With medial retraction of the nerve, I did apply downward pressure around the herniated fragment, one large fragment did readily to clear itself, which was removed using a pituitary.  The wound was then copiously irrigated.  Bleeding was then controlled using Surgiflo.  I then introduce 40 mg of Depo-Medrol about the epidural space.  I then used bone wax to control bleeding at the inferior margin of the L5 lamina. The fascia was then closed using #1 Vicryl.  The subcutaneous layer was closed  using 2-0 Vicryl, and the skin was closed using 3-0 Monocryl. Benzoin and Steri-Strips were applied followed by sterile dressing.  All instrument counts were correct at the termination of the procedure.  Jason CoopKayla McKenzie was my assistant throughout surgery, and did aid in retraction, suctioning, and closure.     Estill BambergMark Dumonski, MD     MD/MEDQ  D:  07/06/2013  T:  07/07/2013  Job:  [id number]

## 2013-07-13 ENCOUNTER — Ambulatory Visit (HOSPITAL_COMMUNITY): Payer: Medicaid Other

## 2014-09-05 ENCOUNTER — Emergency Department (HOSPITAL_COMMUNITY)
Admission: EM | Admit: 2014-09-05 | Discharge: 2014-09-05 | Disposition: A | Discharge status: Home / Self Care | Payer: Medicaid Other | Attending: Emergency Medicine | Admitting: Emergency Medicine

## 2014-09-05 ENCOUNTER — Encounter (HOSPITAL_COMMUNITY): Payer: Self-pay | Admitting: *Deleted

## 2014-09-05 DIAGNOSIS — R200 Anesthesia of skin: Secondary | ICD-10-CM | POA: Insufficient documentation

## 2014-09-05 DIAGNOSIS — Z720 Tobacco use: Secondary | ICD-10-CM | POA: Insufficient documentation

## 2014-09-05 DIAGNOSIS — R531 Weakness: Secondary | ICD-10-CM | POA: Insufficient documentation

## 2014-09-05 DIAGNOSIS — Z8614 Personal history of Methicillin resistant Staphylococcus aureus infection: Secondary | ICD-10-CM | POA: Insufficient documentation

## 2014-09-05 DIAGNOSIS — G8929 Other chronic pain: Secondary | ICD-10-CM | POA: Insufficient documentation

## 2014-09-05 DIAGNOSIS — M545 Low back pain, unspecified: Secondary | ICD-10-CM

## 2014-09-05 MED ORDER — NAPROXEN 250 MG PO TABS
500.0000 mg | ORAL_TABLET | Freq: Once | ORAL | Status: AC
  Administered 2014-09-05: 500 mg via ORAL
  Filled 2014-09-05: qty 2

## 2014-09-05 MED ORDER — OXYCODONE-ACETAMINOPHEN 5-325 MG PO TABS: 1.0000 | ORAL_TABLET | ORAL | Status: DC | PRN

## 2014-09-05 MED ORDER — NAPROXEN 500 MG PO TABS: 500.0000 mg | ORAL_TABLET | Freq: Two times a day (BID) | ORAL | Status: DC

## 2014-09-05 MED ORDER — OXYCODONE-ACETAMINOPHEN 5-325 MG PO TABS
1.0000 | ORAL_TABLET | Freq: Once | ORAL | Status: AC
  Administered 2014-09-05: 1 via ORAL
  Filled 2014-09-05: qty 1

## 2014-09-05 NOTE — ED Provider Notes (Signed)
CSN: [id number]     Arrival date & time 09/05/14  1707 History  This chart was scribed for non-physician practitioner, Burgess Amor, PA-C, working with [name], MD, by Budd Palmer ED Scribe. This patient was seen in room TR01C/TR01C and the patient's care was started at 6:27 PM     Chief Complaint  Patient presents with  . Back Pain   The history is provided by the patient. No language interpreter was used.   HPI Comments: Mitchell Long is a 39 y.o. male who presents to the Emergency Department complaining of worsening,  aching, non-radiating lower back pain onset 1 week ago. He states it has now progressed to the point where he has difficulty walking due to the pain. He reports associated weak feeling and "catching" of the lower back with certain movements.Pain is resolved at rest.  He states he sneezed today and the pain nearly brought him to his knees. He has tried ibuprofen for pain with no relief. He has a PSHx of back surgery (microdiskectomy) done by Dr Yevette Edwards over a year ago. He states he was able to walk with ease after the surgery. He notes the toes on his right foot are numb at baseline since the surgery. Pt states he was on muscle relaxants before the surgery. He states he does not do any heavy lifting at work. He denies any previous injury.  Pt denies fever, n/v, weakness in the legs, as well as bowel or bladder incontinence. He is allergic to Vicodin (heart palpitations and itchiness).  Past Medical History  Diagnosis Date  . Back pain   . Back pain, chronic   . Medical history non-contributory   . MRSA (methicillin resistant staph aureus) culture positive 2010    cellulitis  rt elbow   Past Surgical History  Procedure Laterality Date  . No past surgeries    . Lumbar laminectomy/decompression microdiscectomy Right 07/06/2013    Procedure: RIGHT SIDED LUMBAR 4-5 MICRODISCECTOMY    (1 LEVEL);  Surgeon: Emilee Hero, MD;  Location: [facility] OR;  Service:  Orthopedics;  Laterality: Right;   Family History  Problem Relation Age of Onset  . Other Father     diabetes   Social History  Substance Use Topics  . Smoking status: Current Every Day Smoker -- 1.00 packs/day for 20 years    Types: Cigarettes  . Smokeless tobacco: Never Used  . Alcohol Use: No    Review of Systems  Constitutional: Negative for fever, chills and diaphoresis.  Respiratory: Negative for shortness of breath.   Cardiovascular: Negative for chest pain and leg swelling.  Gastrointestinal: Negative for nausea, vomiting, abdominal pain, constipation and abdominal distention.  Genitourinary: Negative for dysuria, urgency, frequency, flank pain and difficulty urinating.  Musculoskeletal: Positive for back pain. Negative for joint swelling and gait problem.  Skin: Negative for rash.  Neurological: Negative for numbness.    Allergies  Vicodin  Home Medications   Prior to Admission medications   Medication Sig Start Date End Date Taking? Authorizing Provider  naproxen (NAPROSYN) 500 MG tablet Take 1 tablet (500 mg total) by mouth 2 (two) times daily with a meal. 09/05/14   Burgess Amor, PA-C  oxyCODONE-acetaminophen (PERCOCET/ROXICET) 5-325 MG per tablet Take 1 tablet by mouth every 4 (four) hours as needed for severe pain. 09/05/14   Burgess Amor, PA-C   BP 134/71 mmHg  Pulse 79  Temp(Src) 98.2 F (36.8 C) (Oral)  Resp 18  SpO2 99% Physical Exam  Constitutional:  He appears well-developed and well-nourished.  HENT:  Head: Normocephalic.  Eyes: Conjunctivae are normal.  Neck: Normal range of motion. Neck supple.  Cardiovascular: Normal rate and intact distal pulses.   Pedal pulses normal.  Pulmonary/Chest: Effort normal.  Abdominal: Soft. Bowel sounds are normal. He exhibits no distension and no mass.  Musculoskeletal: He exhibits tenderness. He exhibits no edema.       Lumbar back: He exhibits tenderness. He exhibits no swelling, no edema and no spasm.  Midline  and bilateral paralumbar ttp along his lower L-spine with no deformity or step-offs. No edema, no erythema. Well healed midline incision.  Neurological: He is alert. He has normal strength. He displays no atrophy and no tremor. No sensory deficit. Gait normal.  Reflex Scores:      Patellar reflexes are 2+ on the right side and 2+ on the left side.      Achilles reflexes are 2+ on the right side and 2+ on the left side. No strength deficit noted in hip and knee flexor and extensor muscle groups.  Ankle flexion and extension intact.  Skin: Skin is warm and dry.  Psychiatric: He has a normal mood and affect.  Nursing note and vitals reviewed.   ED Course  Procedures  DIAGNOSTIC STUDIES: Oxygen Saturation is 97% on RA, adequate by my interpretation.    COORDINATION OF CARE: 6:41 PM - Discussed plans to order medication for pain and anti-inflammatory medication. Advised to apply heating pad for 15 min 2-3x per day. Advised to call surgeon for a re-check if pain does not improve with tx over the next 5 days or for any worsened sx which were discussed. Advised to return to ED if Sx worsen. Pt advised of plan for treatment and pt agrees.    MDM   Final diagnoses:  Midline low back pain without sciatica    No neuro deficit on exam or by history to suggest emergent or surgical presentation.  Also discussed worsened sx that should prompt immediate re-evaluation including distal weakness, bowel/bladder retention/incontinence.    Pt placed on oxycodone, naproxen.  Advised heat tx, activity as tolerated.  Low back pain without radiculopathy, no new neuro findings.  No fevers, pain better at rest, worse with movement, c/w mechanical low back pain, doubt infectious process.   I personally performed the services described in this documentation, which was scribed in my presence. The recorded information has been reviewed and is accurate.   Burgess Amor, PA-C 09/06/14 1422  [name],  MD 09/07/14 [phone]

## 2014-09-05 NOTE — Discharge Instructions (Signed)
Lumbosacral Strain Lumbosacral strain is a strain of any of the parts that make up your lumbosacral vertebrae. Your lumbosacral vertebrae are the bones that make up the lower third of your backbone. Your lumbosacral vertebrae are held together by muscles and tough, fibrous tissue (ligaments).  CAUSES  A sudden blow to your back can cause lumbosacral strain. Also, anything that causes an excessive stretch of the muscles in the low back can cause this strain. This is typically seen when people exert themselves strenuously, fall, lift heavy objects, bend, or crouch repeatedly. RISK FACTORS  Physically demanding work.  Participation in pushing or pulling sports or sports that require a sudden twist of the back (tennis, golf, baseball).  Weight lifting.  Excessive lower back curvature.  Forward-tilted pelvis.  Weak back or abdominal muscles or both.  Tight hamstrings. SIGNS AND SYMPTOMS  Lumbosacral strain may cause pain in the area of your injury or pain that moves (radiates) down your leg.  DIAGNOSIS Your health care provider can often diagnose lumbosacral strain through a physical exam. In some cases, you may need tests such as X-ray exams.  TREATMENT  Treatment for your lower back injury depends on many factors that your clinician will have to evaluate. However, most treatment will include the use of anti-inflammatory medicines. HOME CARE INSTRUCTIONS   Avoid hard physical activities (tennis, racquetball, waterskiing) if you are not in proper physical condition for it. This may aggravate or create problems.  If you have a back problem, avoid sports requiring sudden body movements. Swimming and walking are generally safer activities.  Maintain good posture.  Maintain a healthy weight.  For acute conditions, you may put ice on the injured area.  Put ice in a plastic bag.  Place a towel between your skin and the bag.  Leave the ice on for 20 minutes, 2-3 times a day.  When the  low back starts healing, stretching and strengthening exercises may be recommended. SEEK MEDICAL CARE IF:  Your back pain is getting worse.  You experience severe back pain not relieved with medicines. SEEK IMMEDIATE MEDICAL CARE IF:   You have numbness, tingling, weakness, or problems with the use of your arms or legs.  There is a change in bowel or bladder control.  You have increasing pain in any area of the body, including your belly (abdomen).  You notice shortness of breath, dizziness, or feel faint.  You feel sick to your stomach (nauseous), are throwing up (vomiting), or become sweaty.  You notice discoloration of your toes or legs, or your feet get very cold. MAKE SURE YOU:   Understand these instructions.  Will watch your condition.  Will get help right away if you are not doing well or get worse. Document Released: 10/08/2004 Document Revised: 01/03/2013 Document Reviewed: 08/17/2012 [facility] Patient Information 2015 [place], [place]. This information is not intended to replace advice given to you by your health care provider. Make sure you discuss any questions you have with your health care provider.   Take the medicines as directed.  Do not drive within 4 hours of taking oxycodone as this will make you drowsy.  Avoid lifting,  Bending,  Twisting or any other activity that worsens your pain over the next week.  Apply an  icepack  to your lower back for 10-15 minutes every 2 hours for the next 2 days.  You should get rechecked if your symptoms are not better over the next 5 days,  Or you develop increased pain,  Weakness in your leg(s) or loss of bladder or bowel function - these are symptoms of a worse injury.

## 2014-09-05 NOTE — ED Notes (Signed)
Pt reports hx of back surgery > 1 yr ago. Began having lower back pain over a week ago but its gotten progressively worse. Denies pain radiating down his legs. Pt is ambulatory at triage.

## 2015-10-28 ENCOUNTER — Encounter (HOSPITAL_COMMUNITY): Payer: Self-pay | Admitting: Emergency Medicine

## 2015-10-28 ENCOUNTER — Emergency Department (HOSPITAL_COMMUNITY): Payer: Medicaid Other

## 2015-10-28 ENCOUNTER — Inpatient Hospital Stay (HOSPITAL_COMMUNITY)
Admission: EM | Admit: 2015-10-28 | Discharge: 2015-10-31 | DRG: 184 | Disposition: A | Discharge status: Home / Self Care | Payer: Medicaid Other | Attending: General Surgery | Admitting: General Surgery

## 2015-10-28 DIAGNOSIS — S01312A Laceration without foreign body of left ear, initial encounter: Secondary | ICD-10-CM | POA: Diagnosis present

## 2015-10-28 DIAGNOSIS — S0181XA Laceration without foreign body of other part of head, initial encounter: Secondary | ICD-10-CM | POA: Diagnosis present

## 2015-10-28 DIAGNOSIS — S1191XA Laceration without foreign body of unspecified part of neck, initial encounter: Secondary | ICD-10-CM | POA: Diagnosis present

## 2015-10-28 DIAGNOSIS — E119 Type 2 diabetes mellitus without complications: Secondary | ICD-10-CM | POA: Diagnosis present

## 2015-10-28 DIAGNOSIS — S2241XA Multiple fractures of ribs, right side, initial encounter for closed fracture: Principal | ICD-10-CM | POA: Diagnosis present

## 2015-10-28 DIAGNOSIS — M549 Dorsalgia, unspecified: Secondary | ICD-10-CM | POA: Diagnosis present

## 2015-10-28 DIAGNOSIS — G8929 Other chronic pain: Secondary | ICD-10-CM | POA: Diagnosis present

## 2015-10-28 DIAGNOSIS — S0990XA Unspecified injury of head, initial encounter: Secondary | ICD-10-CM

## 2015-10-28 DIAGNOSIS — Z833 Family history of diabetes mellitus: Secondary | ICD-10-CM

## 2015-10-28 DIAGNOSIS — F17210 Nicotine dependence, cigarettes, uncomplicated: Secondary | ICD-10-CM | POA: Diagnosis present

## 2015-10-28 DIAGNOSIS — S01311A Laceration without foreign body of right ear, initial encounter: Secondary | ICD-10-CM | POA: Diagnosis present

## 2015-10-28 DIAGNOSIS — S36114A Minor laceration of liver, initial encounter: Secondary | ICD-10-CM | POA: Diagnosis present

## 2015-10-28 HISTORY — DX: Multiple fractures of ribs, unspecified side, initial encounter for closed fracture: S22.49XA

## 2015-10-28 HISTORY — DX: Type 2 diabetes mellitus without complications: E11.9

## 2015-10-28 HISTORY — DX: Unspecified multiple injuries, initial encounter: T07.XXXA

## 2015-10-28 HISTORY — DX: Fracture of one rib, unspecified side, initial encounter for closed fracture: S22.39XA

## 2015-10-28 MED ORDER — SODIUM CHLORIDE 0.9 % IV BOLUS (SEPSIS)
1000.0000 mL | Freq: Once | INTRAVENOUS | Status: AC
  Administered 2015-10-28: 1000 mL via INTRAVENOUS

## 2015-10-28 MED ORDER — ONDANSETRON HCL 4 MG/2ML IJ SOLN
INTRAMUSCULAR | Status: AC
  Filled 2015-10-28: qty 2

## 2015-10-28 MED ORDER — ONDANSETRON HCL 4 MG/2ML IJ SOLN
4.0000 mg | Freq: Once | INTRAMUSCULAR | Status: AC
  Administered 2015-10-28: via INTRAVENOUS

## 2015-10-28 MED ORDER — TETANUS-DIPHTH-ACELL PERTUSSIS 5-2.5-18.5 LF-MCG/0.5 IM SUSP
0.5000 mL | Freq: Once | INTRAMUSCULAR | Status: AC
Start: 2015-10-28 — End: 2015-10-28
  Administered 2015-10-28: 0.5 mL via INTRAMUSCULAR
  Filled 2015-10-28: qty 0.5
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
## 2015-10-29 ENCOUNTER — Encounter (HOSPITAL_COMMUNITY): Payer: Self-pay | Admitting: Radiology

## 2015-10-29 ENCOUNTER — Emergency Department (HOSPITAL_COMMUNITY): Payer: Medicaid Other

## 2015-10-29 DIAGNOSIS — S36114A Minor laceration of liver, initial encounter: Secondary | ICD-10-CM | POA: Diagnosis present

## 2015-10-29 LAB — COMPREHENSIVE METABOLIC PANEL
ALT: 23 U/L (ref 17–63)
AST: 33 U/L (ref 15–41)
Albumin: 4 g/dL (ref 3.5–5.0)
Alkaline Phosphatase: 51 U/L (ref 38–126)
Anion gap: 8 (ref 5–15)
BUN: 11 mg/dL (ref 6–20)
CHLORIDE: 104 mmol/L (ref 101–111)
CO2: 27 mmol/L (ref 22–32)
CREATININE: 1.12 mg/dL (ref 0.61–1.24)
Calcium: 8.6 mg/dL — ABNORMAL LOW (ref 8.9–10.3)
GFR calc Af Amer: >60 mL/min (ref >60)
GFR calc non Af Amer: >60 mL/min (ref >60)
Glucose, Bld: 136 mg/dL — ABNORMAL HIGH (ref 65–99)
POTASSIUM: 4 mmol/L (ref 3.5–5.1)
Sodium: 139 mmol/L (ref 135–145)
Total Bilirubin: 0.6 mg/dL (ref 0.3–1.2)
Total Protein: 6 g/dL — ABNORMAL LOW (ref 6.5–8.1)

## 2015-10-29 LAB — CBC
HCT: 40.9 % (ref 39.0–52.0)
HEMATOCRIT: 44.3 % (ref 39.0–52.0)
HEMOGLOBIN: 14.3 g/dL (ref 13.0–17.0)
Hemoglobin: 15.5 g/dL (ref 13.0–17.0)
MCH: 32.1 pg (ref 26.0–34.0)
MCH: 32.1 pg (ref 26.0–34.0)
MCHC: 35 g/dL (ref 30.0–36.0)
MCHC: 35 g/dL (ref 30.0–36.0)
MCV: 91.7 fL (ref 78.0–100.0)
MCV: 91.7 fL (ref 78.0–100.0)
PLATELETS: 147 10*3/uL — AB (ref 150–400)
PLATELETS: 157 10*3/uL (ref 150–400)
RBC: 4.46 MIL/uL (ref 4.22–5.81)
RBC: 4.83 MIL/uL (ref 4.22–5.81)
RDW: 12.6 % (ref 11.5–15.5)
RDW: 12.6 % (ref 11.5–15.5)
WBC: 11.8 10*3/uL — ABNORMAL HIGH (ref 4.0–10.5)
WBC: 7.3 10*3/uL (ref 4.0–10.5)

## 2015-10-29 LAB — I-STAT CHEM 8, ED
BUN: 15 mg/dL (ref 6–20)
Calcium, Ion: 1.04 mmol/L — ABNORMAL LOW (ref 1.15–1.40)
Chloride: 103 mmol/L (ref 101–111)
Creatinine, Ser: 1 mg/dL (ref 0.61–1.24)
Glucose, Bld: 131 mg/dL — ABNORMAL HIGH (ref 65–99)
HEMATOCRIT: 45 % (ref 39.0–52.0)
HEMOGLOBIN: 15.3 g/dL (ref 13.0–17.0)
POTASSIUM: 4.4 mmol/L (ref 3.5–5.1)
Sodium: 139 mmol/L (ref 135–145)
TCO2: 27 mmol/L (ref 0–100)

## 2015-10-29 LAB — URINALYSIS, ROUTINE W REFLEX MICROSCOPIC
Bilirubin Urine: NEGATIVE
GLUCOSE, UA: NEGATIVE mg/dL
Hgb urine dipstick: NEGATIVE
KETONES UR: 15 mg/dL — AB
Leukocytes, UA: NEGATIVE
NITRITE: NEGATIVE
PH: 5 (ref 5.0–8.0)
Protein, ur: 100 mg/dL — AB
Specific Gravity, Urine: 1.043 — ABNORMAL HIGH (ref 1.005–1.030)

## 2015-10-29 LAB — URINE MICROSCOPIC-ADD ON: RBC / HPF: NONE SEEN RBC/hpf (ref 0–5)

## 2015-10-29 LAB — ETHANOL: Alcohol, Ethyl (B): <5 mg/dL (ref <5)

## 2015-10-29 LAB — SAMPLE TO BLOOD BANK

## 2015-10-29 LAB — I-STAT CG4 LACTIC ACID, ED: Lactic Acid, Venous: 1.05 mmol/L (ref 0.5–1.9)

## 2015-10-29 LAB — PROTIME-INR
INR: 1.07
PROTHROMBIN TIME: 13.9 s (ref 11.4–15.2)

## 2015-10-29 LAB — MRSA PCR SCREENING: MRSA by PCR: NEGATIVE

## 2015-10-29 MED ORDER — OXYCODONE HCL 5 MG PO TABS: 5.0000 mg | ORAL_TABLET | ORAL | Status: DC | PRN

## 2015-10-29 MED ORDER — LIDOCAINE-EPINEPHRINE 1 %-1:100000 IJ SOLN
20.0000 mL | Freq: Once | INTRAMUSCULAR | Status: AC
  Administered 2015-10-29: 20 mL
  Filled 2015-10-29: qty 1

## 2015-10-29 MED ORDER — OXYCODONE HCL 5 MG PO TABS
10.0000 mg | ORAL_TABLET | ORAL | Status: DC | PRN
  Administered 2015-10-29: 10 mg via ORAL
  Filled 2015-10-29: qty 2

## 2015-10-29 MED ORDER — OXYCODONE HCL 5 MG PO TABS
10.0000 mg | ORAL_TABLET | ORAL | Status: DC | PRN
  Administered 2015-10-29: 15 mg via ORAL
  Administered 2015-10-29: 10 mg via ORAL
  Administered 2015-10-30: 15 mg via ORAL
  Filled 2015-10-29 (×2): qty 3

## 2015-10-29 MED ORDER — ONDANSETRON HCL 4 MG PO TABS: 4.0000 mg | ORAL_TABLET | Freq: Four times a day (QID) | ORAL | Status: DC | PRN

## 2015-10-29 MED ORDER — HYDROMORPHONE HCL 1 MG/ML IJ SOLN
0.5000 mg | INTRAMUSCULAR | Status: DC | PRN
  Administered 2015-10-29 (×3): 1 mg via INTRAVENOUS
  Filled 2015-10-29 (×4): qty 1

## 2015-10-29 MED ORDER — ONDANSETRON HCL 4 MG/2ML IJ SOLN: 4.0000 mg | Freq: Four times a day (QID) | INTRAMUSCULAR | Status: DC | PRN

## 2015-10-29 MED ORDER — IOPAMIDOL (ISOVUE-300) INJECTION 61%
INTRAVENOUS | Status: AC
  Administered 2015-10-29: 75 mL
  Filled 2015-10-29: qty 75

## 2015-10-29 MED ORDER — KCL IN DEXTROSE-NACL 20-5-0.45 MEQ/L-%-% IV SOLN
INTRAVENOUS | Status: DC
  Administered 2015-10-29: 06:00:00 via INTRAVENOUS
  Filled 2015-10-29: qty 1000

## 2015-10-29 MED ORDER — NICOTINE 21 MG/24HR TD PT24
21.0000 mg | MEDICATED_PATCH | Freq: Once | TRANSDERMAL | Status: AC
  Administered 2015-10-29 (×2): 21 mg via TRANSDERMAL
  Filled 2015-10-29 (×2): qty 1

## 2015-10-29 MED ORDER — LIDOCAINE-EPINEPHRINE 1 %-1:100000 IJ SOLN: 20.0000 mL | Freq: Once | INTRAMUSCULAR | Status: DC

## 2015-10-29 MED ORDER — HYDROMORPHONE HCL 2 MG/ML IJ SOLN
0.5000 mg | INTRAMUSCULAR | Status: DC | PRN
  Administered 2015-10-29 – 2015-10-30 (×4): 1 mg via INTRAVENOUS
  Filled 2015-10-29 (×4): qty 1

## 2015-10-29 MED ORDER — FENTANYL CITRATE (PF) 100 MCG/2ML IJ SOLN
50.0000 ug | Freq: Once | INTRAMUSCULAR | Status: AC
  Administered 2015-10-29: 50 ug via INTRAVENOUS
  Filled 2015-10-29: qty 2
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
## 2015-10-29 NOTE — ED Notes (Signed)
Dr. Violeta GelinasBurke Thompson ( Trauma MD ) at bedside evaluating pt .

## 2015-10-29 NOTE — ED Notes (Signed)
Attempted to call report

## 2015-10-29 NOTE — ED Provider Notes (Signed)
  LACERATION REPAIR Performed by: Dorthula MatasGREENE,Masen Luallen G Authorized by: Dorthula MatasGREENE,Kage Willmann G Consent: Verbal consent obtained. Risks and benefits: risks, benefits and alternatives were discussed Consent given by: patient Patient identity confirmed: provided demographic data Prepped and Draped in normal sterile fashion Wound explored  Laceration Location: left ear  Laceration Length: 2 cm  No Foreign Bodies seen or palpated  Anesthesia: local infiltration  Local anesthetic: lidocaine 2% 1 epinephrine  Anesthetic total: 2 ml  Irrigation method: syringe Amount of cleaning: standard  Skin closure: dermabond  Patient tolerance: Patient tolerated the procedure well with no immediate complications.          LACERATION REPAIR Performed by: Dorthula MatasGREENE,Zyan Mirkin G Authorized by: Dorthula MatasGREENE,Maeley Matton G Consent: Verbal consent obtained. Risks and benefits: risks, benefits and alternatives were discussed Consent given by: patient Patient identity confirmed: provided demographic data Prepped and Draped in normal sterile fashion Wound explored  Laceration Location: over left mastoid  Laceration Length: 3 cm  No Foreign Bodies seen or palpated  Anesthesia: local infiltration  Local anesthetic: lidocaine 2% 1 epinephrine  Anesthetic total: 3 ml  Irrigation method: syringe Amount of cleaning: standard  Skin closure: sutures prolene 4'0  Number of sutures: 4  Technique: simple interrupted,   Patient tolerance: Patient tolerated the procedure well with no immediate complications.           LACERATION REPAIR Performed by: Dorthula MatasGREENE,Nedda Gains G Authorized by: Dorthula MatasGREENE,Alixandria Friedt G Consent: Verbal consent obtained. Risks and benefits: risks, benefits and alternatives were discussed Consent given by: patient Patient identity confirmed: provided demographic data Prepped and Draped in normal sterile fashion Wound explored  Laceration Location: right ear Laceration Length: 1cm  No  Foreign Bodies seen or palpated  Skin closure: Dermabond- surgical glue  Patient tolerance: Patient tolerated the procedure well with no immediate complications.         LACERATION REPAIR Performed by: Dorthula MatasGREENE,Shayleen Eppinger G Authorized by: Dorthula MatasGREENE,Malayasia Mirkin G Consent: Verbal consent obtained. Risks and benefits: risks, benefits and alternatives were discussed Consent given by: patient Patient identity confirmed: provided demographic data Prepped and Draped in normal sterile fashion Wound explored  Laceration Location: left neck  Laceration Length: 1 cm  No Foreign Bodies seen or palpated  Anesthesia: local infiltration  Local anesthetic: lidocaine 2% 1 epinephrine  Anesthetic total: 2 ml  Irrigation method: syringe Amount of cleaning: standard  Skin closure: sutures, prolene 4'0   Number of sutures: 2  Technique: simple interrupted  Patient tolerance: Patient tolerated the procedure well with no immediate complications.      Patient seen by Dr. Blinda LeatherwoodPollina, please refer to his note for HPI, ROS, PE, and DISPO.    Mitchell Peliffany Jiyah Torpey, PA-C 10/29/15 [id number]    Mitchell Creasehristopher J Pollina, MD 10/29/15 [phone]

## 2015-10-30 DIAGNOSIS — S0181XA Laceration without foreign body of other part of head, initial encounter: Secondary | ICD-10-CM | POA: Diagnosis present

## 2015-10-30 DIAGNOSIS — S2241XA Multiple fractures of ribs, right side, initial encounter for closed fracture: Secondary | ICD-10-CM | POA: Diagnosis present

## 2015-10-30 LAB — CBC
HCT: 43.6 % (ref 39.0–52.0)
HEMOGLOBIN: 15.1 g/dL (ref 13.0–17.0)
MCH: 31.7 pg (ref 26.0–34.0)
MCHC: 34.6 g/dL (ref 30.0–36.0)
MCV: 91.4 fL (ref 78.0–100.0)
PLATELETS: 156 10*3/uL (ref 150–400)
RBC: 4.77 MIL/uL (ref 4.22–5.81)
RDW: 12.7 % (ref 11.5–15.5)
WBC: 6.5 10*3/uL (ref 4.0–10.5)

## 2015-10-30 MED ORDER — BACITRACIN ZINC 500 UNIT/GM EX OINT
TOPICAL_OINTMENT | Freq: Two times a day (BID) | CUTANEOUS | Status: DC
  Administered 2015-10-30 – 2015-10-31 (×3): via TOPICAL
  Filled 2015-10-30 (×2): qty 28.35

## 2015-10-30 MED ORDER — DOCUSATE SODIUM 100 MG PO CAPS
100.0000 mg | ORAL_CAPSULE | Freq: Two times a day (BID) | ORAL | Status: DC
  Administered 2015-10-31: 100 mg via ORAL
  Filled 2015-10-30 (×2): qty 1

## 2015-10-30 MED ORDER — TRAMADOL HCL 50 MG PO TABS
100.0000 mg | ORAL_TABLET | Freq: Four times a day (QID) | ORAL | Status: DC
  Administered 2015-10-30 – 2015-10-31 (×3): 100 mg via ORAL
  Filled 2015-10-30 (×3): qty 2

## 2015-10-30 MED ORDER — OXYCODONE HCL 5 MG PO TABS
10.0000 mg | ORAL_TABLET | ORAL | Status: DC | PRN
  Administered 2015-10-30 (×2): 20 mg via ORAL
  Administered 2015-10-30 (×2): 15 mg via ORAL
  Administered 2015-10-31 (×3): 20 mg via ORAL
  Filled 2015-10-30: qty 4
  Filled 2015-10-30: qty 3
  Filled 2015-10-30: qty 4
  Filled 2015-10-30: qty 3
  Filled 2015-10-30 (×3): qty 4

## 2015-10-30 MED ORDER — POLYETHYLENE GLYCOL 3350 17 G PO PACK
17.0000 g | PACK | Freq: Every day | ORAL | Status: DC
  Administered 2015-10-31: 17 g via ORAL
  Filled 2015-10-30: qty 1

## 2015-10-30 MED ORDER — NICOTINE 21 MG/24HR TD PT24
21.0000 mg | MEDICATED_PATCH | Freq: Every day | TRANSDERMAL | Status: DC
  Administered 2015-10-30 – 2015-10-31 (×2): 21 mg via TRANSDERMAL
  Filled 2015-10-30 (×2): qty 1

## 2015-10-30 MED ORDER — HYDROMORPHONE HCL 2 MG/ML IJ SOLN
0.5000 mg | INTRAMUSCULAR | Status: DC | PRN
  Administered 2015-10-30 – 2015-10-31 (×3): 0.5 mg via INTRAVENOUS
  Filled 2015-10-30 (×3): qty 1
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
## 2015-10-31 MED ORDER — OXYCODONE-ACETAMINOPHEN 10-325 MG PO TABS: 1.0000 | ORAL_TABLET | ORAL | 0 refills | Status: DC | PRN

## 2015-10-31 MED ORDER — TRAMADOL HCL 50 MG PO TABS: 100.0000 mg | ORAL_TABLET | Freq: Four times a day (QID) | ORAL | 0 refills | Status: DC
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
## 2015-10-31 NOTE — Discharge Instructions (Signed)
Wash wounds daily in shower with soap and water. °Do not soak. °Apply antibiotic ointment (e.g. Neosporin) twice daily and as needed to keep moist. ° °No driving while taking oxycodone. ° °
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
## 2015-11-14 ENCOUNTER — Emergency Department (HOSPITAL_COMMUNITY): Payer: Self-pay

## 2015-11-14 ENCOUNTER — Encounter (HOSPITAL_COMMUNITY): Payer: Self-pay | Admitting: *Deleted

## 2015-11-14 ENCOUNTER — Inpatient Hospital Stay (HOSPITAL_COMMUNITY)
Admission: EM | Admit: 2015-11-14 | Discharge: 2015-11-19 | DRG: 200 | Disposition: A | Discharge status: Home / Self Care | Payer: Self-pay | Attending: General Surgery | Admitting: General Surgery

## 2015-11-14 DIAGNOSIS — D62 Acute posthemorrhagic anemia: Secondary | ICD-10-CM | POA: Diagnosis not present

## 2015-11-14 DIAGNOSIS — M549 Dorsalgia, unspecified: Secondary | ICD-10-CM | POA: Diagnosis present

## 2015-11-14 DIAGNOSIS — R079 Chest pain, unspecified: Secondary | ICD-10-CM

## 2015-11-14 DIAGNOSIS — J942 Hemothorax: Secondary | ICD-10-CM | POA: Diagnosis present

## 2015-11-14 DIAGNOSIS — Z885 Allergy status to narcotic agent status: Secondary | ICD-10-CM

## 2015-11-14 DIAGNOSIS — S2241XA Multiple fractures of ribs, right side, initial encounter for closed fracture: Secondary | ICD-10-CM | POA: Diagnosis present

## 2015-11-14 DIAGNOSIS — Z833 Family history of diabetes mellitus: Secondary | ICD-10-CM

## 2015-11-14 DIAGNOSIS — F17210 Nicotine dependence, cigarettes, uncomplicated: Secondary | ICD-10-CM | POA: Diagnosis present

## 2015-11-14 DIAGNOSIS — G8929 Other chronic pain: Secondary | ICD-10-CM | POA: Diagnosis present

## 2015-11-14 DIAGNOSIS — S272XXA Traumatic hemopneumothorax, initial encounter: Principal | ICD-10-CM | POA: Diagnosis present

## 2015-11-14 LAB — COMPREHENSIVE METABOLIC PANEL
ALBUMIN: 4.1 g/dL (ref 3.5–5.0)
ALT: 26 U/L (ref 17–63)
AST: 27 U/L (ref 15–41)
Alkaline Phosphatase: 72 U/L (ref 38–126)
Anion gap: 13 (ref 5–15)
BUN: 21 mg/dL — AB (ref 6–20)
CHLORIDE: 99 mmol/L — AB (ref 101–111)
CO2: 24 mmol/L (ref 22–32)
Calcium: 9.5 mg/dL (ref 8.9–10.3)
Creatinine, Ser: 1.29 mg/dL — ABNORMAL HIGH (ref 0.61–1.24)
GFR calc Af Amer: >60 mL/min (ref >60)
GFR calc non Af Amer: >60 mL/min (ref >60)
GLUCOSE: 169 mg/dL — AB (ref 65–99)
POTASSIUM: 4.7 mmol/L (ref 3.5–5.1)
Sodium: 136 mmol/L (ref 135–145)
Total Bilirubin: 0.7 mg/dL (ref 0.3–1.2)
Total Protein: 6.7 g/dL (ref 6.5–8.1)

## 2015-11-14 LAB — CBC
HCT: 43.2 % (ref 39.0–52.0)
HCT: 35.1 % — ABNORMAL LOW (ref 39.0–52.0)
Hemoglobin: 15.1 g/dL (ref 13.0–17.0)
Hemoglobin: 12.3 g/dL — ABNORMAL LOW (ref 13.0–17.0)
MCH: 32.5 pg (ref 26.0–34.0)
MCH: 31.6 pg (ref 26.0–34.0)
MCHC: 35 g/dL (ref 30.0–36.0)
MCHC: 35 g/dL (ref 30.0–36.0)
MCV: 93.1 fL (ref 78.0–100.0)
MCV: 90.2 fL (ref 78.0–100.0)
PLATELETS: 360 10*3/uL (ref 150–400)
PLATELETS: 266 10*3/uL (ref 150–400)
RBC: 4.64 MIL/uL (ref 4.22–5.81)
RBC: 3.89 MIL/uL — AB (ref 4.22–5.81)
RDW: 12.7 % (ref 11.5–15.5)
RDW: 12.2 % (ref 11.5–15.5)
WBC: 18.8 10*3/uL — AB (ref 4.0–10.5)
WBC: 13.5 10*3/uL — AB (ref 4.0–10.5)

## 2015-11-14 LAB — LIPASE, BLOOD: Lipase: 31 U/L (ref 11–51)

## 2015-11-14 LAB — I-STAT TROPONIN, ED: TROPONIN I, POC: 0.03 ng/mL (ref 0.00–0.08)

## 2015-11-14 LAB — CBG MONITORING, ED: GLUCOSE-CAPILLARY: 153 mg/dL — AB (ref 65–99)

## 2015-11-14 LAB — PREPARE RBC (CROSSMATCH)

## 2015-11-14 MED ORDER — MIDAZOLAM HCL 2 MG/2ML IJ SOLN
2.0000 mg | Freq: Once | INTRAMUSCULAR | Status: AC
  Administered 2015-11-14: 2 mg via INTRAVENOUS
  Filled 2015-11-14: qty 2

## 2015-11-14 MED ORDER — PANTOPRAZOLE SODIUM 40 MG PO TBEC
40.0000 mg | DELAYED_RELEASE_TABLET | Freq: Every day | ORAL | Status: DC
  Administered 2015-11-15 – 2015-11-17 (×3): 40 mg via ORAL

## 2015-11-14 MED ORDER — TRAMADOL HCL 50 MG PO TABS
100.0000 mg | ORAL_TABLET | Freq: Four times a day (QID) | ORAL | Status: DC
  Administered 2015-11-14 – 2015-11-19 (×19): 100 mg via ORAL
  Filled 2015-11-14 (×21): qty 2

## 2015-11-14 MED ORDER — HYDROMORPHONE HCL 2 MG/ML IJ SOLN
1.0000 mg | INTRAMUSCULAR | Status: DC | PRN
  Administered 2015-11-14: 1 mg via INTRAVENOUS
  Filled 2015-11-14: qty 1

## 2015-11-14 MED ORDER — OXYCODONE HCL 5 MG PO TABS
5.0000 mg | ORAL_TABLET | ORAL | Status: DC | PRN
  Administered 2015-11-14: 5 mg via ORAL
  Filled 2015-11-14: qty 1

## 2015-11-14 MED ORDER — ONDANSETRON HCL 4 MG PO TABS: 4.0000 mg | ORAL_TABLET | Freq: Four times a day (QID) | ORAL | Status: DC | PRN

## 2015-11-14 MED ORDER — OXYCODONE-ACETAMINOPHEN 10-325 MG PO TABS: 1.0000 | ORAL_TABLET | ORAL | Status: DC | PRN

## 2015-11-14 MED ORDER — FENTANYL CITRATE (PF) 100 MCG/2ML IJ SOLN
50.0000 ug | Freq: Once | INTRAMUSCULAR | Status: AC
Start: 2015-11-14 — End: 2015-11-14
  Administered 2015-11-14: 50 ug via INTRAVENOUS
  Filled 2015-11-14: qty 2

## 2015-11-14 MED ORDER — BISACODYL 10 MG RE SUPP: 10.0000 mg | Freq: Every day | RECTAL | Status: DC | PRN

## 2015-11-14 MED ORDER — OXYCODONE-ACETAMINOPHEN 5-325 MG PO TABS
1.0000 | ORAL_TABLET | ORAL | Status: DC | PRN
  Administered 2015-11-14 (×3): 2 via ORAL
  Filled 2015-11-14 (×4): qty 2

## 2015-11-14 MED ORDER — ONDANSETRON HCL 4 MG/2ML IJ SOLN: 4.0000 mg | Freq: Four times a day (QID) | INTRAMUSCULAR | Status: DC | PRN

## 2015-11-14 MED ORDER — SODIUM CHLORIDE 0.9 % IV SOLN: Freq: Once | INTRAVENOUS | Status: DC

## 2015-11-14 MED ORDER — LIDOCAINE HCL (PF) 1 % IJ SOLN
INTRAMUSCULAR | Status: AC
  Filled 2015-11-14: qty 30

## 2015-11-14 MED ORDER — LIDOCAINE HCL (PF) 1 % IJ SOLN
30.0000 mL | Freq: Once | INTRAMUSCULAR | Status: AC
  Administered 2015-11-14: 30 mL via INTRADERMAL

## 2015-11-14 MED ORDER — NICOTINE 21 MG/24HR TD PT24
21.0000 mg | MEDICATED_PATCH | Freq: Every day | TRANSDERMAL | Status: DC
  Administered 2015-11-14 – 2015-11-19 (×6): 21 mg via TRANSDERMAL
  Filled 2015-11-14 (×7): qty 1

## 2015-11-14 MED ORDER — DOCUSATE SODIUM 100 MG PO CAPS
100.0000 mg | ORAL_CAPSULE | Freq: Two times a day (BID) | ORAL | Status: DC
  Administered 2015-11-15 – 2015-11-17 (×2): 100 mg via ORAL
  Filled 2015-11-14 (×8): qty 1

## 2015-11-14 MED ORDER — PANTOPRAZOLE SODIUM 40 MG PO TBEC
40.0000 mg | DELAYED_RELEASE_TABLET | Freq: Every day | ORAL | Status: DC
  Filled 2015-11-14 (×3): qty 1

## 2015-11-14 MED ORDER — HYDROMORPHONE HCL 2 MG/ML IJ SOLN
1.0000 mg | Freq: Once | INTRAMUSCULAR | Status: AC
  Administered 2015-11-14: 1 mg via INTRAVENOUS
  Filled 2015-11-14: qty 1

## 2015-11-14 MED ORDER — SODIUM CHLORIDE 0.9 % IV SOLN
Freq: Once | INTRAVENOUS | Status: AC
  Administered 2015-11-14: 05:00:00 via INTRAVENOUS

## 2015-11-14 MED ORDER — HYDROMORPHONE HCL 1 MG/ML IJ SOLN
1.0000 mg | INTRAMUSCULAR | Status: DC | PRN
  Administered 2015-11-14 (×4): 2 mg via INTRAVENOUS
  Administered 2015-11-14: 1 mg via INTRAVENOUS
  Administered 2015-11-14: 2 mg via INTRAVENOUS
  Administered 2015-11-14: 1 mg via INTRAVENOUS
  Administered 2015-11-15 (×3): 2 mg via INTRAVENOUS
  Filled 2015-11-14 (×5): qty 2
  Filled 2015-11-14: qty 1
  Filled 2015-11-14 (×2): qty 2
  Filled 2015-11-14: qty 1
  Filled 2015-11-14: qty 2

## 2015-11-14 MED ORDER — IOPAMIDOL (ISOVUE-370) INJECTION 76%
INTRAVENOUS | Status: AC
  Administered 2015-11-14: 80 mL
  Filled 2015-11-14: qty 100

## 2015-11-14 MED ORDER — KETOROLAC TROMETHAMINE 30 MG/ML IJ SOLN
30.0000 mg | Freq: Once | INTRAMUSCULAR | Status: AC
Start: 2015-11-14 — End: 2015-11-14
  Administered 2015-11-14: 30 mg via INTRAVENOUS
  Filled 2015-11-14: qty 1

## 2015-11-14 MED ORDER — POTASSIUM CHLORIDE IN NACL 20-0.9 MEQ/L-% IV SOLN
INTRAVENOUS | Status: DC
  Administered 2015-11-14 – 2015-11-15 (×2): via INTRAVENOUS
  Filled 2015-11-14 (×2): qty 1000
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
## 2015-11-14 NOTE — ED Notes (Signed)
Pt taken to CT.

## 2015-11-14 NOTE — Progress Notes (Signed)
Patient ID: Mitchell Long, male   DOB: [birth year], 40 y.o.   MRN: [id number] Just admitted from ED. CT has put out only 200cc more. Lungs CTA. Advance diet. Violeta GelinasBurke Gusta Marksberry, MD, MPH, FACS Trauma: [phone] General Surgery: [phone]
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
## 2015-11-14 NOTE — Procedures (Signed)
Chest Tube Insertion Procedure Note  Indications:  Clinically significant Hemothorax  Pre-operative Diagnosis: Hemothorax  Post-operative Diagnosis: Hemothorax and Massive  Procedure Details  Informed consent was obtained for the procedure, including sedation.  Risks of lung perforation, hemorrhage, arrhythmia, and adverse drug reaction were discussed.   After sterile skin prep, using standard technique, a 32 French tube was placed in the right anterolateral 6th rib space.  Findings: 2200 ml of bloody fluid obtained  Estimated Blood Loss:  2.2 liters         Specimens:  None              Complications:  None; patient tolerated the procedure well.         Disposition: ICU - extubated and stable.         Condition: stable  Attending Attestation: I performed the procedure.  CXR shows good position of the right chest tube

## 2015-11-14 NOTE — ED Triage Notes (Signed)
The pt is c/o rt sided chest pain since 1730 today c/o sob and vomiting

## 2015-11-14 NOTE — ED Notes (Signed)
Chest tube tray, consent, and lidocaine at bedside for trauma surgery.

## 2015-11-14 NOTE — ED Notes (Signed)
Patient transported to CT 
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
## 2015-11-15 ENCOUNTER — Inpatient Hospital Stay (HOSPITAL_COMMUNITY): Payer: Self-pay

## 2015-11-15 LAB — CBC
HEMATOCRIT: 30.6 % — AB (ref 39.0–52.0)
HEMOGLOBIN: 10.6 g/dL — AB (ref 13.0–17.0)
MCH: 31.5 pg (ref 26.0–34.0)
MCHC: 34.6 g/dL (ref 30.0–36.0)
MCV: 90.8 fL (ref 78.0–100.0)
Platelets: 199 10*3/uL (ref 150–400)
RBC: 3.37 MIL/uL — AB (ref 4.22–5.81)
RDW: 12.3 % (ref 11.5–15.5)
WBC: 8.9 10*3/uL (ref 4.0–10.5)

## 2015-11-15 LAB — BASIC METABOLIC PANEL
ANION GAP: 4 — AB (ref 5–15)
BUN: 10 mg/dL (ref 6–20)
CALCIUM: 8.3 mg/dL — AB (ref 8.9–10.3)
CHLORIDE: 101 mmol/L (ref 101–111)
CO2: 29 mmol/L (ref 22–32)
Creatinine, Ser: 0.79 mg/dL (ref 0.61–1.24)
GFR calc non Af Amer: >60 mL/min (ref >60)
Glucose, Bld: 104 mg/dL — ABNORMAL HIGH (ref 65–99)
POTASSIUM: 3.8 mmol/L (ref 3.5–5.1)
Sodium: 134 mmol/L — ABNORMAL LOW (ref 135–145)

## 2015-11-15 MED ORDER — HYDROMORPHONE HCL 2 MG/ML IJ SOLN
1.0000 mg | INTRAMUSCULAR | Status: DC | PRN
  Administered 2015-11-15 – 2015-11-18 (×19): 2 mg via INTRAVENOUS
  Filled 2015-11-15 (×19): qty 1

## 2015-11-15 MED ORDER — BACITRACIN ZINC 500 UNIT/GM EX OINT
TOPICAL_OINTMENT | Freq: Two times a day (BID) | CUTANEOUS | Status: DC
  Administered 2015-11-15 – 2015-11-18 (×5): via TOPICAL
  Filled 2015-11-15: qty 28.35

## 2015-11-15 MED ORDER — OXYCODONE HCL 5 MG PO TABS
10.0000 mg | ORAL_TABLET | ORAL | Status: DC | PRN
  Administered 2015-11-15 – 2015-11-19 (×25): 20 mg via ORAL
  Filled 2015-11-15 (×27): qty 4

## 2015-11-15 MED ORDER — SODIUM CHLORIDE 0.9 % IV SOLN: 250.0000 mL | INTRAVENOUS | Status: DC | PRN

## 2015-11-15 MED ORDER — SODIUM CHLORIDE 0.9% FLUSH
3.0000 mL | INTRAVENOUS | Status: DC | PRN
  Filled 2015-11-15: qty 3

## 2015-11-15 MED ORDER — SODIUM CHLORIDE 0.9% FLUSH
3.0000 mL | Freq: Two times a day (BID) | INTRAVENOUS | Status: DC
  Administered 2015-11-15 – 2015-11-19 (×9): 3 mL via INTRAVENOUS

## 2015-11-15 NOTE — Progress Notes (Signed)
Pt called for po pain medicine at 1600hrs. Attended to pt and notified him that it was too early for his oxycodone as last dose had been administered at 1235hrs. Pt seemed not happy about the fact that pain meds were not due at this time. Informed him that nurse will bring back pain meds around 1630hrs. Returned to pt 's room at 1630hrs with his prn pain meds he had asked for earlier, pt noted to be asleep and will wait for him to wake up and call for the pain meds as requested earlier

## 2015-11-15 NOTE — Progress Notes (Signed)
Pt observed to be still laying in bed with eyes closed,no signs of distress noted

## 2015-11-15 NOTE — Progress Notes (Signed)
Prn oxycodone and tramadol administered when pt finally woke up around 1800hrs. Returned to pt's room to do bedside reporting and found all medicines on pt's bedside table untouched. Pt attempted to make it seem like he had not been given his medicines as ordered but MAR administration indicates that he was given his meds each time there were due. Pt refused to take his po meds. meds were wasted with oncoming nurse and flushed down the sink

## 2015-11-15 NOTE — Progress Notes (Signed)
Pt woken up by dietary staff at 1800hrs. Scores pain at 12 out of 10. Medicated as ordered
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
## 2015-11-16 ENCOUNTER — Inpatient Hospital Stay (HOSPITAL_COMMUNITY): Payer: Self-pay
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
## 2015-11-17 ENCOUNTER — Inpatient Hospital Stay (HOSPITAL_COMMUNITY): Payer: Self-pay

## 2015-11-17 LAB — CBC
HCT: 27.3 % — ABNORMAL LOW (ref 39.0–52.0)
HEMOGLOBIN: 9.6 g/dL — AB (ref 13.0–17.0)
MCH: 31.6 pg (ref 26.0–34.0)
MCHC: 35.2 g/dL (ref 30.0–36.0)
MCV: 89.8 fL (ref 78.0–100.0)
PLATELETS: 228 10*3/uL (ref 150–400)
RBC: 3.04 MIL/uL — AB (ref 4.22–5.81)
RDW: 12 % (ref 11.5–15.5)
WBC: 6.3 10*3/uL (ref 4.0–10.5)
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
## 2015-11-18 ENCOUNTER — Inpatient Hospital Stay (HOSPITAL_COMMUNITY): Payer: Self-pay

## 2015-11-18 LAB — TYPE AND SCREEN
ABO/RH(D): A NEG
Antibody Screen: NEGATIVE
UNIT DIVISION: 0
Unit division: 0

## 2015-11-18 LAB — CBC
HEMATOCRIT: 27.1 % — AB (ref 39.0–52.0)
HEMOGLOBIN: 9.5 g/dL — AB (ref 13.0–17.0)
MCH: 31.8 pg (ref 26.0–34.0)
MCHC: 35.1 g/dL (ref 30.0–36.0)
MCV: 90.6 fL (ref 78.0–100.0)
Platelets: 250 10*3/uL (ref 150–400)
RBC: 2.99 MIL/uL — AB (ref 4.22–5.81)
RDW: 12.2 % (ref 11.5–15.5)
WBC: 5.4 10*3/uL (ref 4.0–10.5)

## 2015-11-18 MED ORDER — HYDROMORPHONE HCL 1 MG/ML IJ SOLN
0.5000 mg | INTRAMUSCULAR | Status: DC | PRN
  Administered 2015-11-18 – 2015-11-19 (×5): 0.5 mg via INTRAVENOUS
  Filled 2015-11-18 (×5): qty 0.5

## 2015-11-18 NOTE — Progress Notes (Signed)
Patient requesting to take tramadol with Dilaudid injections and not when the tramadol is scheduled. I asked PA Leotis ShamesJeffery if we could make tramadol prn, he said to leave it where it was scheduled and to only use the dilaudid as breakthrough.

## 2015-11-18 NOTE — Progress Notes (Signed)
Patient had been asking for pain medicine frequently about every 2 hours with Oxy 20 mg with IV Dilaudid 2 mg at interval. States that Ultram does not help with the pain.
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
## 2015-11-19 ENCOUNTER — Inpatient Hospital Stay (HOSPITAL_COMMUNITY): Payer: Self-pay

## 2015-11-19 MED ORDER — OXYCODONE-ACETAMINOPHEN 10-325 MG PO TABS: 1.0000 | ORAL_TABLET | ORAL | 0 refills | Status: DC | PRN

## 2015-11-19 MED ORDER — TRAMADOL HCL 50 MG PO TABS: 100.0000 mg | ORAL_TABLET | Freq: Four times a day (QID) | ORAL | 0 refills | Status: DC
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
## 2015-11-19 NOTE — Discharge Instructions (Signed)
Leave chest dressing on until Wednesday, then remove and Wash wounds daily in shower with soap and water. Do not soak. Apply antibiotic ointment (e.g. Neosporin) twice daily and as needed to keep moist. Cover with dry dressing.  No driving while taking oxycodone.  [facility]. You do not need an appointment.
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
## 2015-11-19 NOTE — Progress Notes (Signed)
Right chest dressing clean, dry and intact. Discharge instructions given to pt. Verbalized understanding. Discharged home accompanied by father.
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
## 2015-12-02 ENCOUNTER — Ambulatory Visit (HOSPITAL_COMMUNITY)
Admission: RE | Admit: 2015-12-02 | Discharge: 2015-12-02 | Disposition: A | Discharge status: Home / Self Care | Payer: Self-pay | Source: Ambulatory Visit | Attending: Orthopedic Surgery | Admitting: Orthopedic Surgery

## 2015-12-02 ENCOUNTER — Other Ambulatory Visit: Payer: Self-pay | Admitting: Orthopedic Surgery

## 2015-12-02 ENCOUNTER — Telehealth (HOSPITAL_COMMUNITY): Payer: Self-pay

## 2015-12-02 DIAGNOSIS — S2241XA Multiple fractures of ribs, right side, initial encounter for closed fracture: Secondary | ICD-10-CM | POA: Insufficient documentation

## 2015-12-02 DIAGNOSIS — S272XXS Traumatic hemopneumothorax, sequela: Secondary | ICD-10-CM

## 2015-12-02 DIAGNOSIS — J90 Pleural effusion, not elsewhere classified: Secondary | ICD-10-CM | POA: Insufficient documentation

## 2015-12-02 DIAGNOSIS — X58XXXA Exposure to other specified factors, initial encounter: Secondary | ICD-10-CM | POA: Insufficient documentation

## 2015-12-02 DIAGNOSIS — R918 Other nonspecific abnormal finding of lung field: Secondary | ICD-10-CM | POA: Insufficient documentation

## 2015-12-02 NOTE — Progress Notes (Signed)
Placed order for f/u 2 view CXR

## 2015-12-03 NOTE — Telephone Encounter (Signed)
LM communicating CXR results and agreed to refill Perc 10/325 and Tramadol 50mg  #20 each.

## 2015-12-12 ENCOUNTER — Telehealth (HOSPITAL_COMMUNITY): Payer: Self-pay

## 2015-12-12 NOTE — Telephone Encounter (Signed)
LM to call back.

## 2015-12-17 ENCOUNTER — Telehealth (HOSPITAL_COMMUNITY): Payer: Self-pay
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
## 2016-04-13 ENCOUNTER — Encounter (HOSPITAL_COMMUNITY): Payer: Self-pay

## 2016-04-13 ENCOUNTER — Emergency Department (HOSPITAL_COMMUNITY)
Admission: EM | Admit: 2016-04-13 | Discharge: 2016-04-13 | Disposition: A | Discharge status: Home / Self Care | Payer: Self-pay | Attending: Emergency Medicine | Admitting: Emergency Medicine

## 2016-04-13 ENCOUNTER — Emergency Department (HOSPITAL_COMMUNITY): Payer: Self-pay

## 2016-04-13 DIAGNOSIS — Y929 Unspecified place or not applicable: Secondary | ICD-10-CM | POA: Insufficient documentation

## 2016-04-13 DIAGNOSIS — Y999 Unspecified external cause status: Secondary | ICD-10-CM | POA: Insufficient documentation

## 2016-04-13 DIAGNOSIS — F17210 Nicotine dependence, cigarettes, uncomplicated: Secondary | ICD-10-CM | POA: Insufficient documentation

## 2016-04-13 DIAGNOSIS — E119 Type 2 diabetes mellitus without complications: Secondary | ICD-10-CM | POA: Insufficient documentation

## 2016-04-13 DIAGNOSIS — W228XXA Striking against or struck by other objects, initial encounter: Secondary | ICD-10-CM | POA: Insufficient documentation

## 2016-04-13 DIAGNOSIS — Y9389 Activity, other specified: Secondary | ICD-10-CM | POA: Insufficient documentation

## 2016-04-13 DIAGNOSIS — S62650A Nondisplaced fracture of medial phalanx of right index finger, initial encounter for closed fracture: Secondary | ICD-10-CM | POA: Insufficient documentation

## 2016-04-13 LAB — CBG MONITORING, ED: Glucose-Capillary: 147 mg/dL — ABNORMAL HIGH (ref 65–99)

## 2016-04-13 MED ORDER — MORPHINE SULFATE (PF) 4 MG/ML IV SOLN
6.0000 mg | Freq: Once | INTRAVENOUS | Status: AC
  Administered 2016-04-13: 6 mg via INTRAMUSCULAR
  Filled 2016-04-13: qty 2

## 2016-04-13 MED ORDER — OXYCODONE-ACETAMINOPHEN 5-325 MG PO TABS: 1.0000 | ORAL_TABLET | Freq: Four times a day (QID) | ORAL | 0 refills | Status: DC | PRN

## 2016-04-13 MED ORDER — KETOROLAC TROMETHAMINE 30 MG/ML IJ SOLN
60.0000 mg | Freq: Once | INTRAMUSCULAR | Status: AC
  Administered 2016-04-13: 60 mg via INTRAMUSCULAR
  Filled 2016-04-13: qty 2
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
## 2016-04-13 NOTE — Progress Notes (Signed)
Orthopedic Tech Progress Note Patient Details:  Mitchell Long 18-Jul-1975 [id number]  Ortho Devices Type of Ortho Device: Finger splint Ortho Device/Splint Location: r index finger splint Ortho Device/Splint Interventions: Ordered, Application   Trinna Post 04/13/2016, 8:04 PM
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
## 2016-04-13 NOTE — ED Notes (Signed)
Paged Ortho about finger splint

## 2016-04-13 NOTE — Discharge Instructions (Signed)
Follow up with the hand surgeon provided. Ice and elevate the finger. Return here as needed

## 2017-12-07 ENCOUNTER — Ambulatory Visit: Payer: Self-pay | Admitting: Podiatry

## 2017-12-17 ENCOUNTER — Ambulatory Visit (INDEPENDENT_AMBULATORY_CARE_PROVIDER_SITE_OTHER): Payer: PRIVATE HEALTH INSURANCE | Admitting: Podiatry

## 2017-12-17 ENCOUNTER — Encounter: Payer: Self-pay | Admitting: Podiatry

## 2017-12-17 DIAGNOSIS — L600 Ingrowing nail: Secondary | ICD-10-CM | POA: Diagnosis not present

## 2017-12-17 DIAGNOSIS — A4902 Methicillin resistant Staphylococcus aureus infection, unspecified site: Secondary | ICD-10-CM

## 2017-12-17 MED ORDER — DOXYCYCLINE HYCLATE 100 MG PO TABS: 100.0000 mg | ORAL_TABLET | Freq: Two times a day (BID) | ORAL | 0 refills | Status: AC

## 2017-12-17 NOTE — Patient Instructions (Signed)
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
## 2018-01-19 IMAGING — CR DG CHEST 1V PORT
1 series · 1 of 1 positions shown · non-contrast
Comparison: 10/28/2015 chest radiograph.  10/29/2015 CT chest.

CLINICAL DATA: 40 y/o  M; right-sided chest pain and vomiting.

EXAM:
PORTABLE CHEST 1 VIEW

[AP]
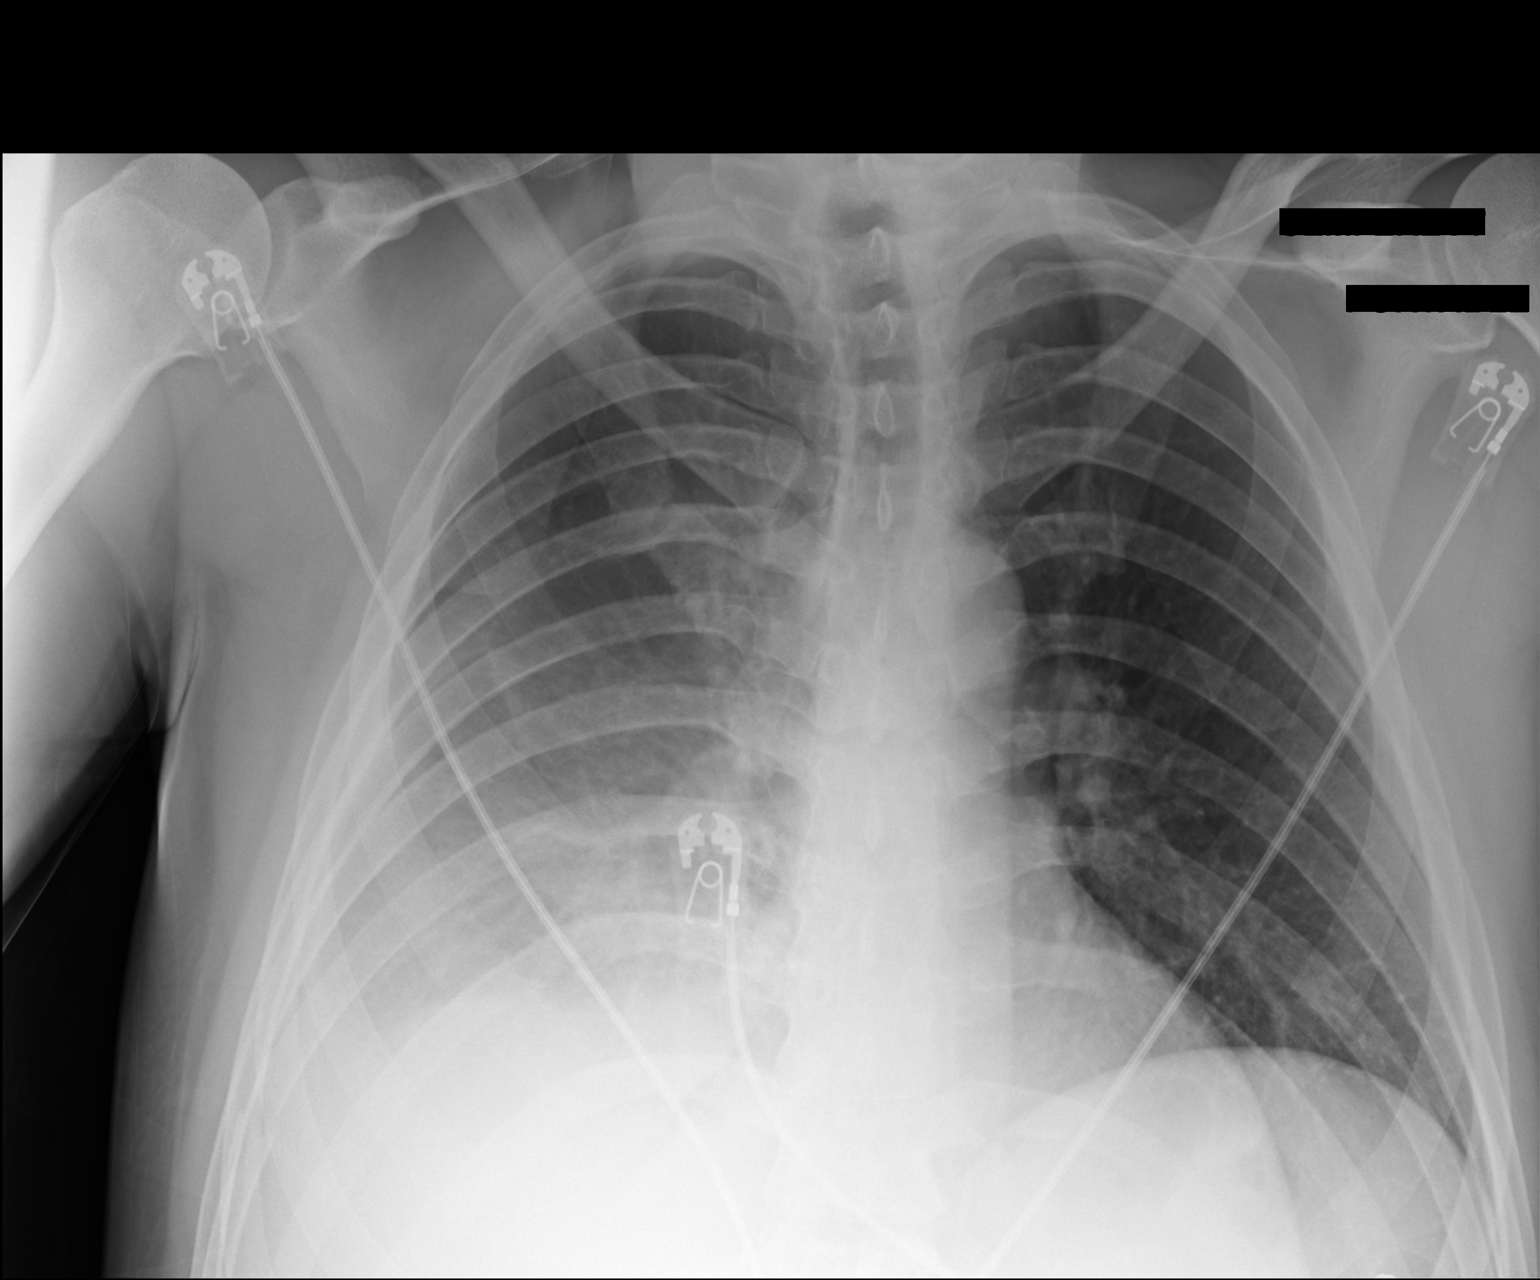

[1 of 1 positions shown; findings below may reference images not displayed]

FINDINGS: The right-sided posterior lateral ninth and tenth rib fractures as
seen on prior CT chest. Small to moderate right-sided pleural
effusion. No pneumothorax. Right basilar opacity probably represents
associated atelectasis. Clear left lung. No new acute osseous
abnormality is evident
IMPRESSION: Small to moderate right-sided pleural effusion and associated
basilar opacity which probably represents atelectasis. Right 9 and
10 posterior lateral rib fractures as seen on prior CT.

By: Naiya De La Rosa M.D.

## 2018-01-19 IMAGING — CT CT ANGIO CHEST
2 of 9 series · 18 of 46 positions shown · IV contrast (isovue)
Comparison: Radiographs 11/14/2015, CT 10/29/15

CLINICAL DATA: Severe right-sided chest pain, onset at [DATE]

EXAM:
CT ANGIOGRAPHY CHEST WITH CONTRAST
TECHNIQUE: Multidetector CT imaging of the chest was performed using the
standard protocol during bolus administration of intravenous
contrast. Multiplanar CT image reconstructions and MIPs were
obtained to evaluate the vascular anatomy.
CONTRAST:  80 mL Isovue 370

[Series 6: thins · axial · 0.76mm/px · z∈[-307,-2]mm · 15 of 337 slices shown]
[im 16/337  lung]
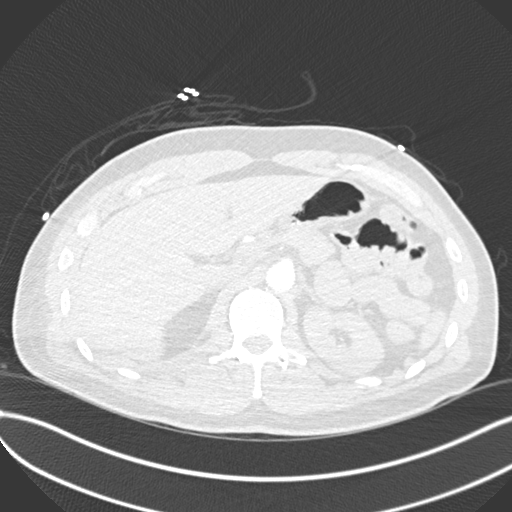
[im 46/337  soft-tissue]
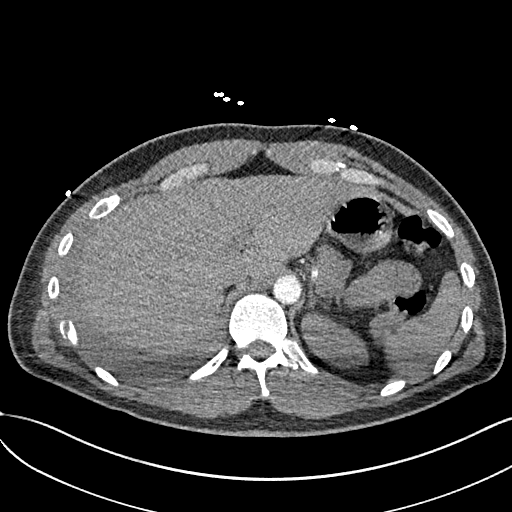
[im 62/337  lung]
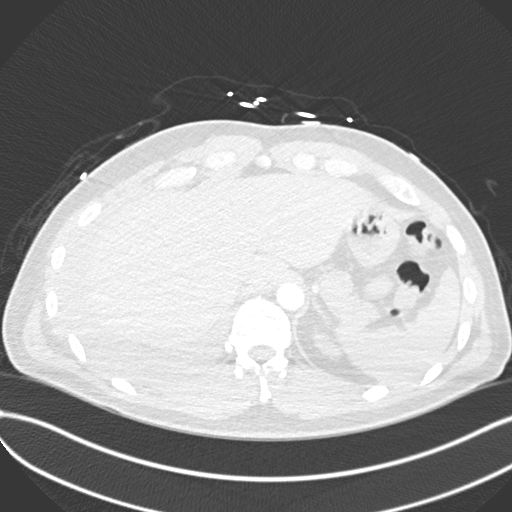
[im 77/337  soft-tissue]
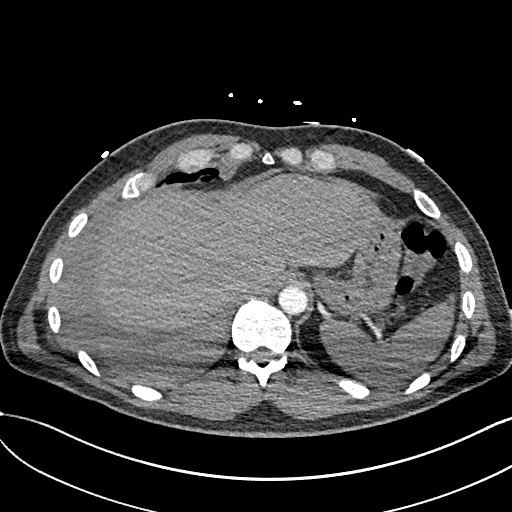
[im 107/337  lung]
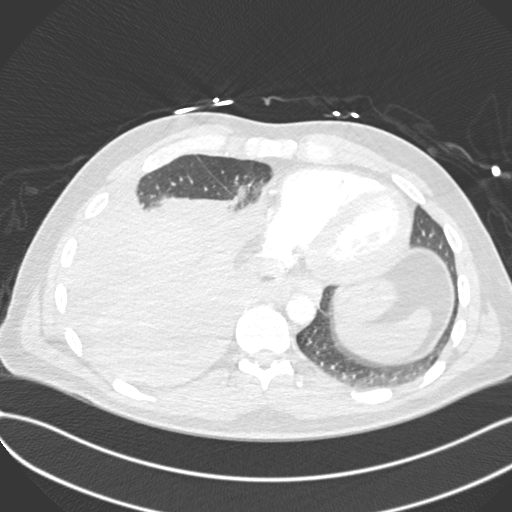
[im 123/337  soft-tissue]
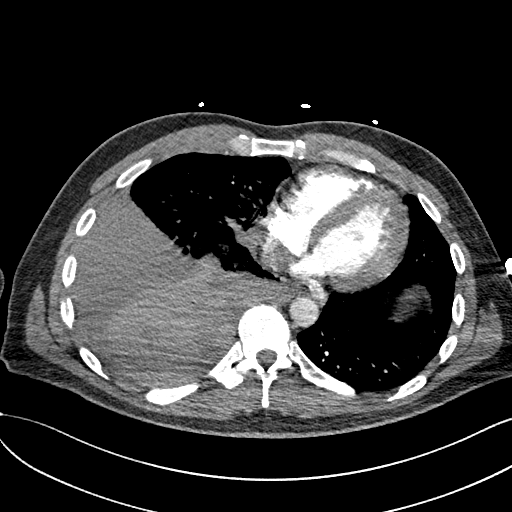
[im 153/337  lung]
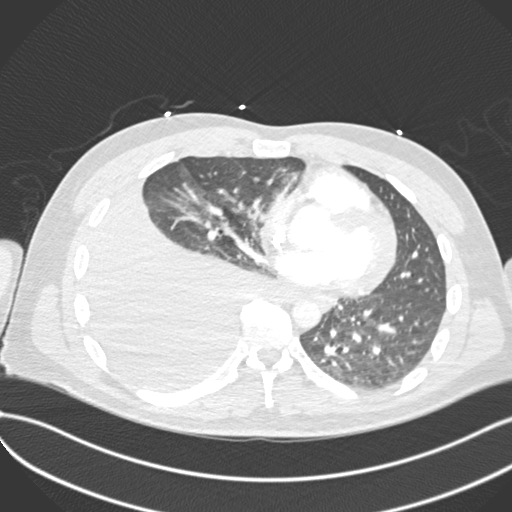
[im 169/337  soft-tissue]
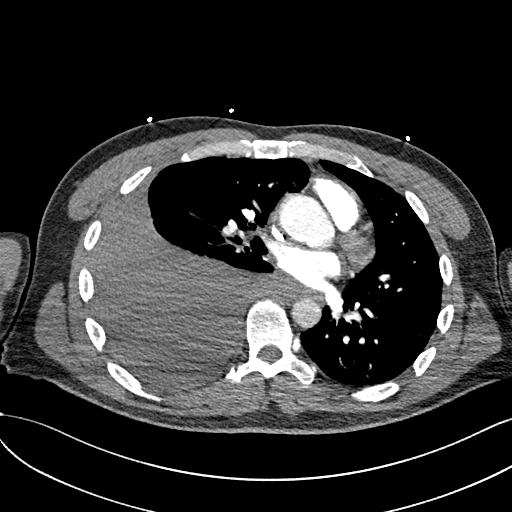
[im 184/337  lung]
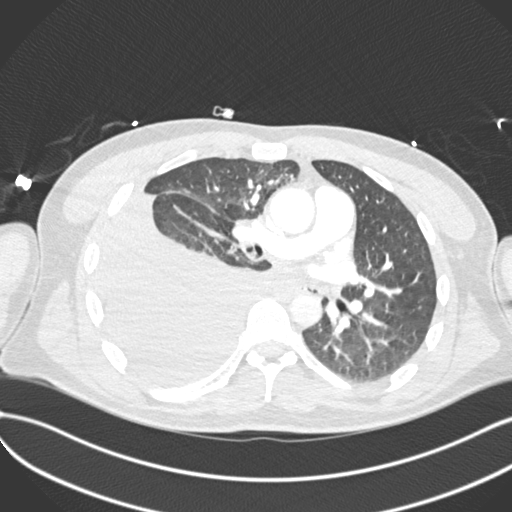
[im 214/337  soft-tissue]
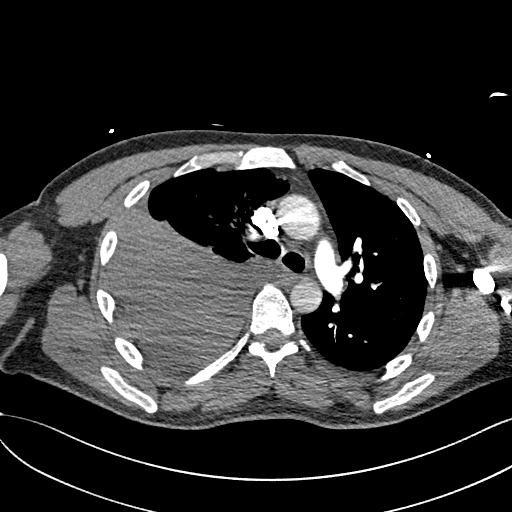
[im 230/337  lung]
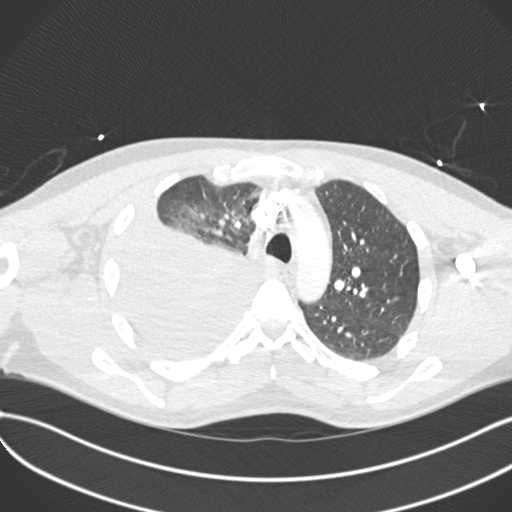
[im 260/337  soft-tissue]
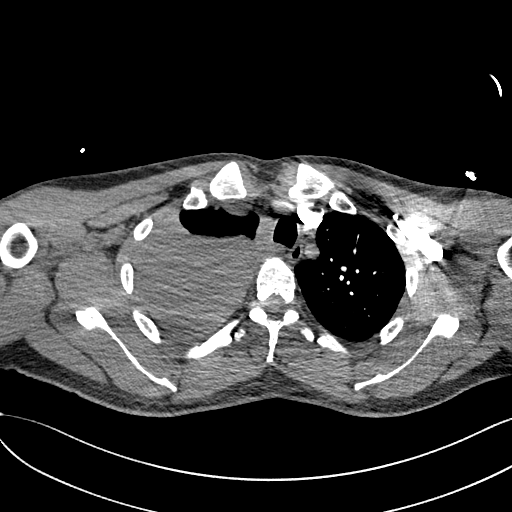
[im 275/337  lung]
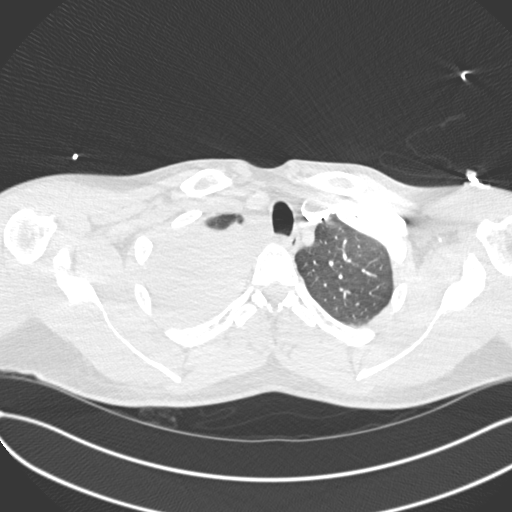
[im 291/337  soft-tissue]
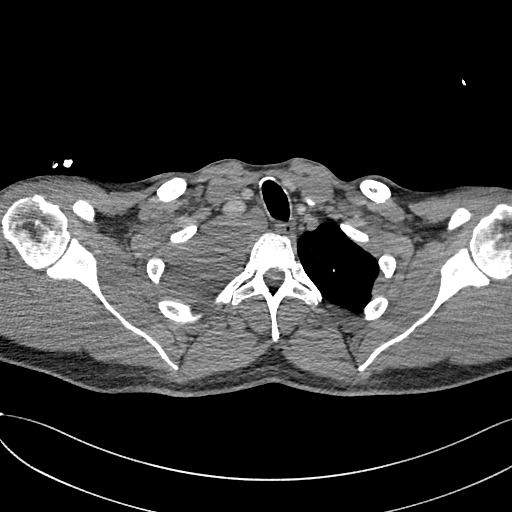
[im 321/337  lung]
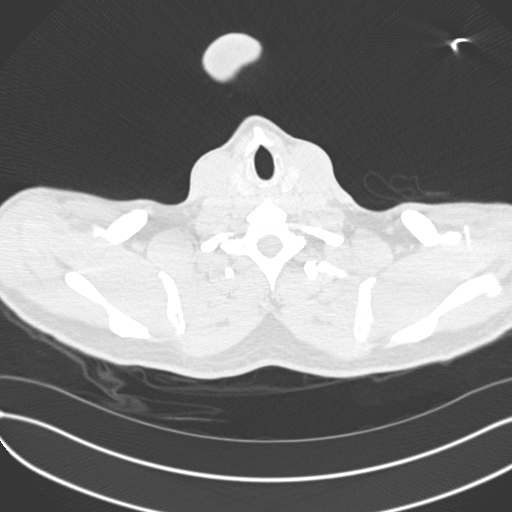

[Series 8: coronal mpr · coronal · 0.66mm/px · 3 of 116 slices shown]
[im 29/116  soft-tissue]
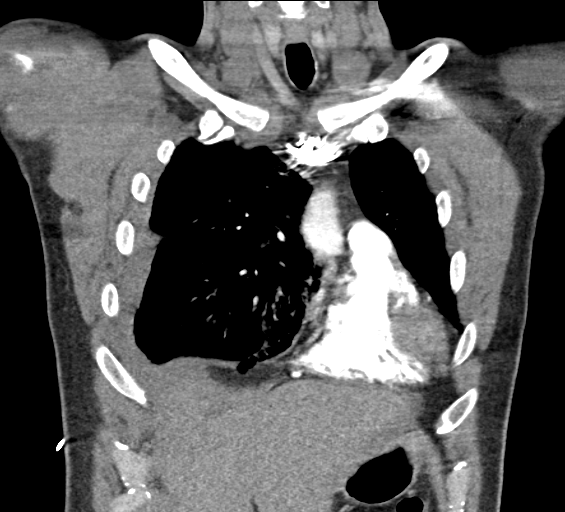
[im 58/116  soft-tissue]
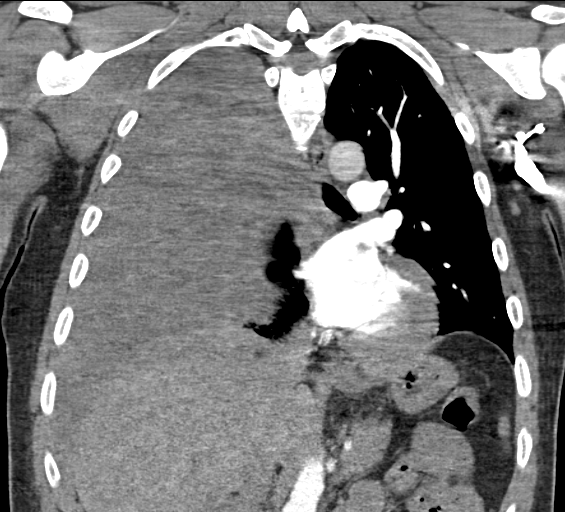
[im 87/116  soft-tissue]
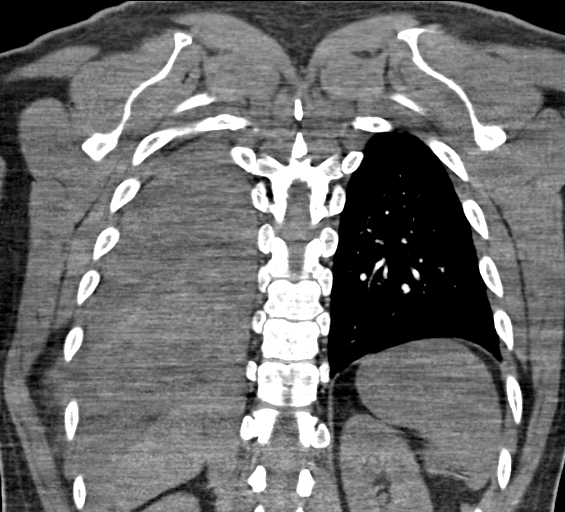

[18 of 46 positions shown; findings below may reference images not displayed]

FINDINGS: Cardiovascular: Satisfactory opacification of the pulmonary arteries
to the segmental level. No evidence of pulmonary embolism. Normal
heart size. No pericardial effusion.

Mediastinum/Nodes: No enlarged mediastinal, hilar, or axillary lymph
nodes. Thyroid gland, trachea, and esophagus demonstrate no
significant findings.

Lungs/Pleura: Very large right pleural collection, probably acute
hemorrhage. Higher density material within the hemothorax suggests
active hemorrhage at the time of this scan. No pneumothorax. Mild
compressive atelectasis of the right lower lobe adjacent to the
pleural collection.

Upper Abdomen: No significant upper abdominal abnormality.

Musculoskeletal: Fractures of the right ninth and tenth ribs
laterally, as previously described.

Review of the MIP images confirms the above findings.
IMPRESSION: 1. Very large right hemothorax, probably with active hemorrhage at
the time of this scan.
2. No pulmonary embolism.
3. Subacute fractures of the right ninth and tenth ribs laterally.
These results were called by telephone at the time of interpretation
on 11/14/2015 at [DATE] to Dr. NGUYEN GORTON , who verbally
acknowledged these results.

## 2018-01-22 IMAGING — CR DG CHEST 1V PORT
1 series · 1 of 1 positions shown · non-contrast
Comparison: 11/16/2015

CLINICAL DATA: History of broken ribs and pneumothorax, followup

EXAM:
PORTABLE CHEST 1 VIEW

[AP]
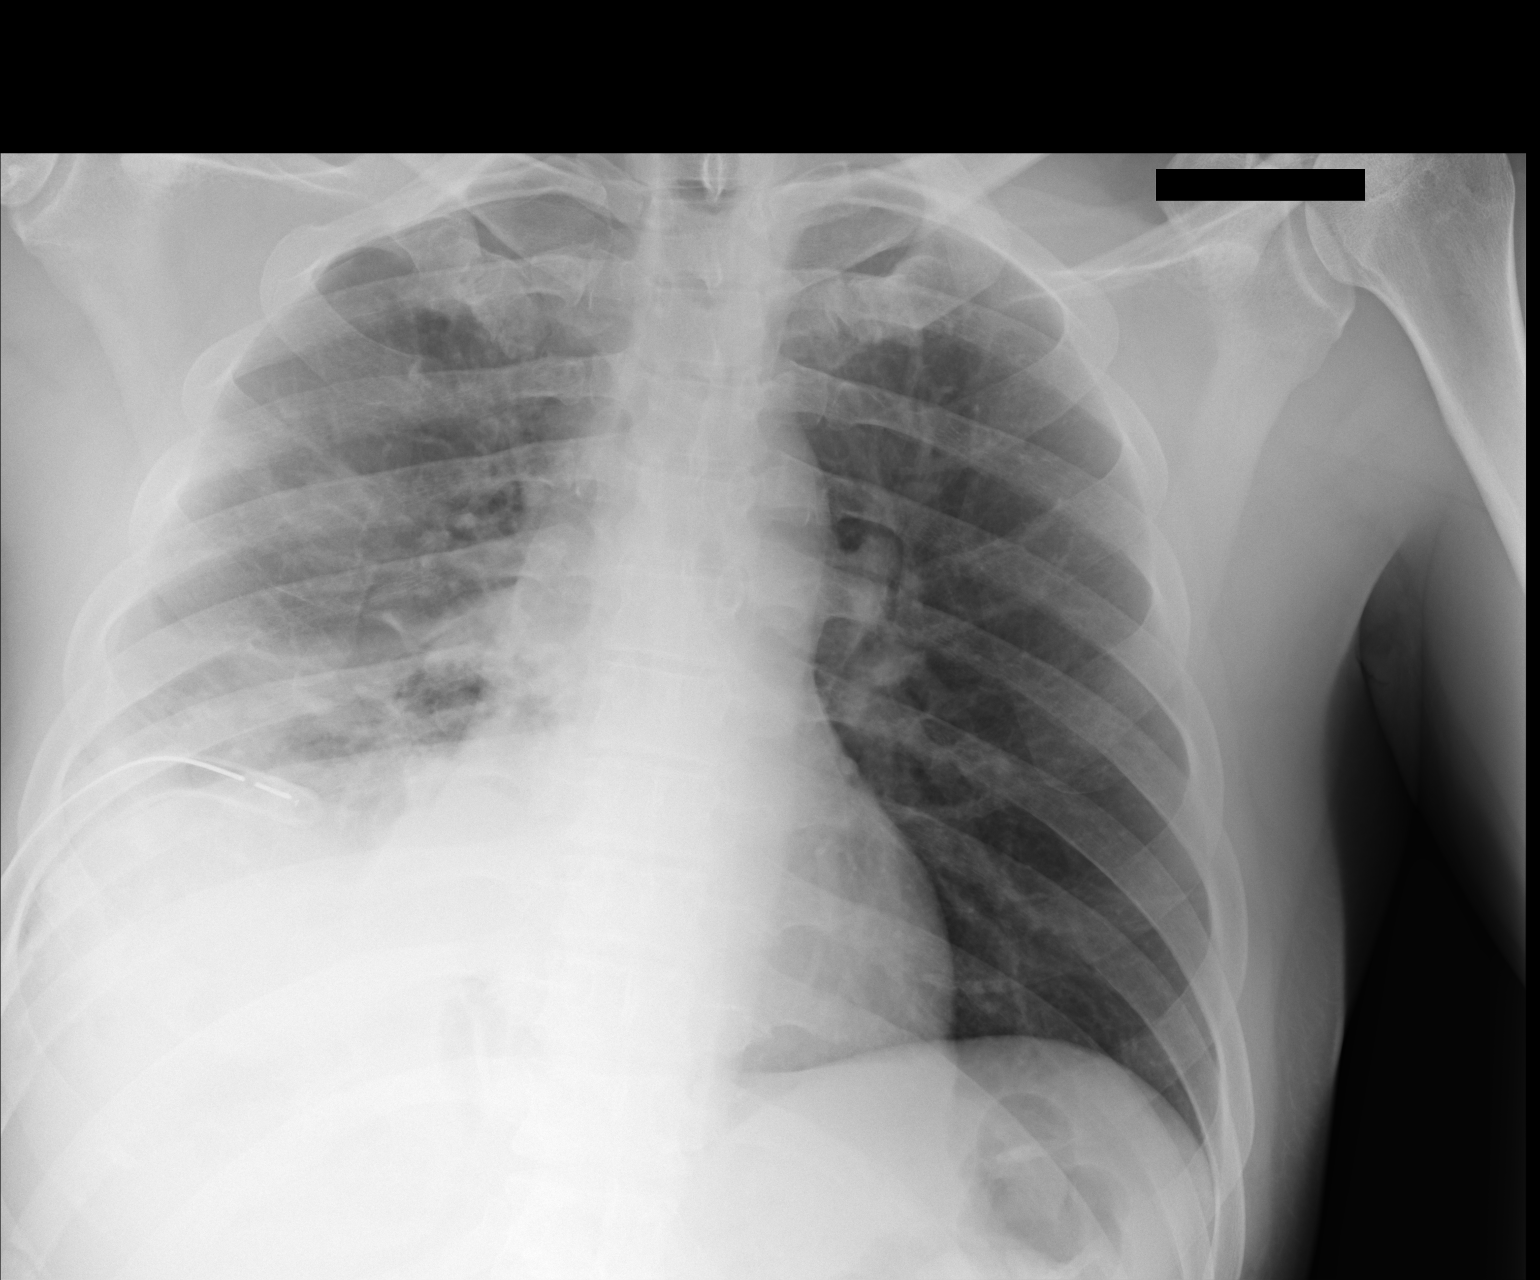

[1 of 1 positions shown; findings below may reference images not displayed]

FINDINGS: Cardiac shadow is stable. The left lung remains clear. There is some
mild improved aeration in the right lung base although persistent
atelectasis/infiltrate remains. The right pneumothorax seen on the
prior exam continues to improve with only a tiny residual component.
Right chest tube is again seen.
IMPRESSION: Continued improving right pneumothorax. Some improved aeration is
noted in the right lung base.

## 2018-01-24 IMAGING — DX DG CHEST 1V PORT
1 series · 1 of 1 positions shown · non-contrast
Comparison: Portable chest x-ray November 18, 2015

CLINICAL DATA: Traumatic hemo pneumothorax. Chest tube removal
yesterday.

EXAM:
PORTABLE CHEST 1 VIEW

[chest ap]
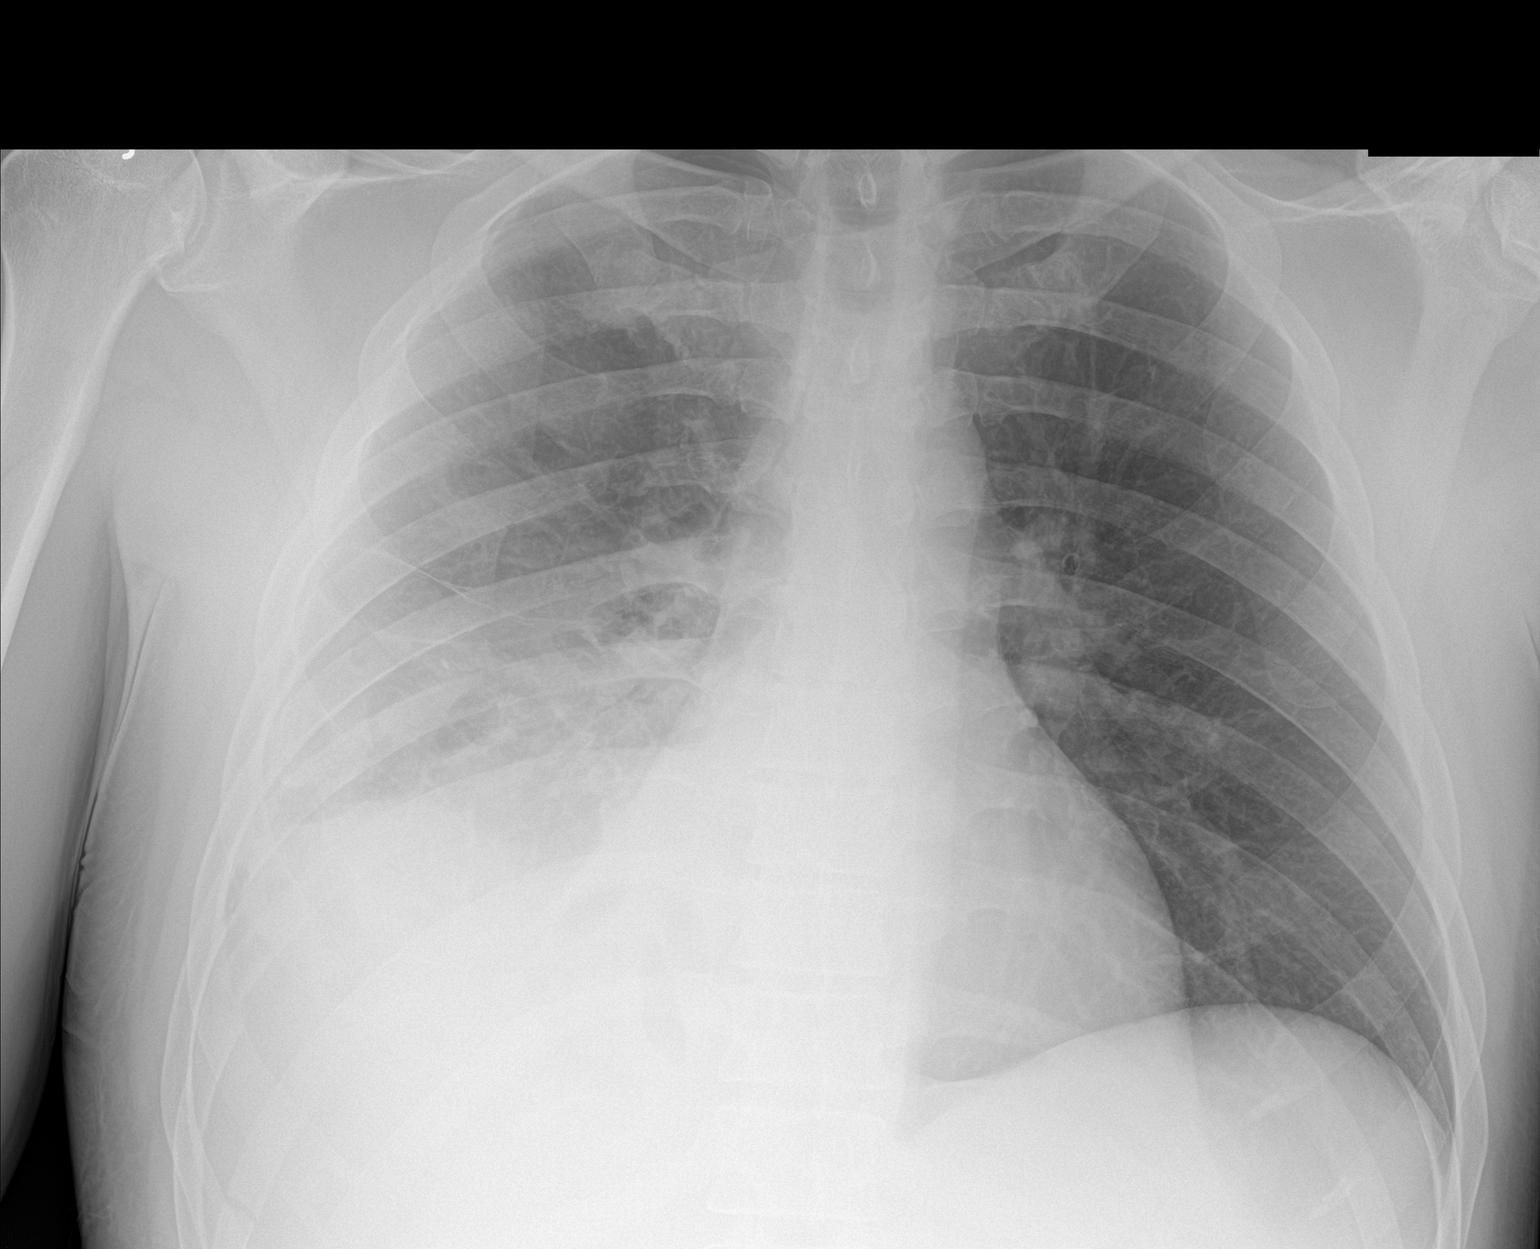

[1 of 1 positions shown; findings below may reference images not displayed]

FINDINGS: There is a stable 5% or less pneumothorax in the right apex. There
remains a moderate amount of pleural fluid at the right lung base.
There is atelectasis or infiltrate in the right middle and lower
lobe. The left lung is clear. The mediastinum is not shifted. The
heart and pulmonary vascularity are normal. There is are multiple
right lower lateral rib fractures.
IMPRESSION: Stable hemopneumothorax on the right. Persistent right basilar
atelectasis or pneumonia.
